# Patient Record
Sex: Male | Born: 1995 | Race: White | Hispanic: No | Marital: Single | Smoking: Current every day smoker
Health system: Southern US, Community
[De-identification: ages and names within clinical notes are randomized; demographics above are authoritative.]

## PROBLEM LIST (undated history)

## (undated) DIAGNOSIS — F1010 Alcohol abuse, uncomplicated: Secondary | ICD-10-CM

---

## 2017-02-23 ENCOUNTER — Emergency Department: Payer: Self-pay

## 2017-02-23 ENCOUNTER — Emergency Department
Admission: EM | Admit: 2017-02-23 | Discharge: 2017-02-23 | Disposition: A | Discharge status: Home / Self Care | Payer: Self-pay | Attending: Student in an Organized Health Care Education/Training Program | Admitting: Student in an Organized Health Care Education/Training Program

## 2017-02-23 DIAGNOSIS — R079 Chest pain, unspecified: Secondary | ICD-10-CM

## 2017-02-23 DIAGNOSIS — F419 Anxiety disorder, unspecified: Secondary | ICD-10-CM | POA: Insufficient documentation

## 2017-02-23 LAB — CBC WITH DIFFERENTIAL/PLATELET
BASOS PCT: 1 %
Basophils Absolute: 0 10*3/uL (ref 0–0.1)
EOS ABS: 0 10*3/uL (ref 0–0.7)
Eosinophils Relative: 0 %
HEMATOCRIT: 47 % (ref 40.0–52.0)
HEMOGLOBIN: 16.3 g/dL (ref 13.0–18.0)
LYMPHS ABS: 0.4 10*3/uL — AB (ref 1.0–3.6)
Lymphocytes Relative: 11 %
MCH: 32.2 pg (ref 26.0–34.0)
MCHC: 34.7 g/dL (ref 32.0–36.0)
MCV: 92.8 fL (ref 80.0–100.0)
Monocytes Absolute: 0.4 10*3/uL (ref 0.2–1.0)
Monocytes Relative: 10 %
NEUTROS PCT: 78 %
Neutro Abs: 3.1 10*3/uL (ref 1.4–6.5)
Platelets: 175 10*3/uL (ref 150–440)
RBC: 5.07 MIL/uL (ref 4.40–5.90)
RDW: 13.6 % (ref 11.5–14.5)
WBC: 4 10*3/uL (ref 3.8–10.6)

## 2017-02-23 LAB — BASIC METABOLIC PANEL
Anion gap: 9 (ref 5–15)
BUN: 12 mg/dL (ref 6–20)
CHLORIDE: 103 mmol/L (ref 101–111)
CO2: 25 mmol/L (ref 22–32)
Calcium: 9.7 mg/dL (ref 8.9–10.3)
Creatinine, Ser: 0.92 mg/dL (ref 0.61–1.24)
GFR calc non Af Amer: >60 mL/min (ref >60)
Glucose, Bld: 131 mg/dL — ABNORMAL HIGH (ref 65–99)
POTASSIUM: 3.8 mmol/L (ref 3.5–5.1)
SODIUM: 137 mmol/L (ref 135–145)

## 2017-02-23 LAB — TROPONIN I: Troponin I: <0.03 ng/mL (ref <0.03)

## 2017-02-23 LAB — FIBRIN DERIVATIVES D-DIMER (ARMC ONLY): FIBRIN DERIVATIVES D-DIMER (ARMC): 103.56 (ref 0.00–499.00)

## 2017-02-23 MED ORDER — LORAZEPAM 1 MG PO TABS
1.0000 mg | ORAL_TABLET | Freq: Once | ORAL | Status: AC
  Administered 2017-02-23: 1 mg via ORAL
  Filled 2017-02-23: qty 1

## 2017-02-23 NOTE — ED Provider Notes (Signed)
[facility] Emergency Department Provider Note    First MD Initiated Contact with Patient 02/23/17 [phone]     (approximate)  I have reviewed the triage vital signs and the nursing notes.   HISTORY  Chief Complaint Chest Pain    HPI Robert Briggs is a 21 y.o. male presents with intermittent chest pain shortness of breath for the past several weeks.States that the symptoms are associated with general warm feeling and occasional nausea. States it is worsened with stress at work. Was seen at urgent care and was told he had anxiety. Does have significant family history of anxiety on his mother's side. Denies any history of heart disease. Does not have any active chest pain right now but had episode this morning that he wanted to be checked out. Denies any SI or HI. No recent fevers. No nausea or vomiting.   History reviewed. No pertinent past medical history. FMH:  anxiety History reviewed. No pertinent surgical history. There are no active problems to display for this patient.     Prior to Admission medications   Not on File    Allergies Patient has no known allergies.    Social History Social History  Substance Use Topics  . Smoking status: Never Smoker  . Smokeless tobacco: Never Used  . Alcohol use No    Review of Systems Patient denies headaches, rhinorrhea, blurry vision, numbness, shortness of breath, chest pain, edema, cough, abdominal pain, nausea, vomiting, diarrhea, dysuria, fevers, rashes or hallucinations unless otherwise stated above in HPI. ____________________________________________   PHYSICAL EXAM:  VITAL SIGNS: Vitals:   02/23/17 0851  BP: (!) 146/89  Pulse: (!) 121  Resp: 20  Temp: 97.7 F (36.5 C)    Constitutional: Alert and oriented. Well appearing and in no acute distress. Eyes: Conjunctivae are normal. PERRL. EOMI. Head: Atraumatic. Nose: No congestion/rhinnorhea. Mouth/Throat: Mucous membranes are  moist.  Oropharynx non-erythematous. Neck: No stridor. Painless ROM. No cervical spine tenderness to palpation Hematological/Lymphatic/Immunilogical: No cervical lymphadenopathy. Cardiovascular: Normal rate, regular rhythm. Grossly normal heart sounds.  Good peripheral circulation. Respiratory: Normal respiratory effort.  No retractions. Lungs CTAB. Gastrointestinal: Soft and nontender. No distention. No abdominal bruits. No CVA tenderness. Genitourinary:  Musculoskeletal: No lower extremity tenderness nor edema.  No joint effusions. Neurologic:  Normal speech and language. No gross focal neurologic deficits are appreciated. No gait instability. Skin:  Skin is warm, dry and intact. No rash noted. Psychiatric: anxious appearing  ____________________________________________   LABS (all labs ordered are listed, but only abnormal results are displayed)  No results found for this or any previous visit (from the past 24 hour(s)). ____________________________________________  EKG My review and personal interpretation at Time: 4:17   Indication: chest pain  Rate: 130  Rhythm: sinus Axis: normal Other: sinus tachycardia, normal intervals, non specific st changes ____________________________________________  RADIOLOGY  I personally reviewed all radiographic images ordered to evaluate for the above acute complaints and reviewed radiology reports and findings.  These findings were personally discussed with the patient.  Please see medical record for radiology report.  ____________________________________________   PROCEDURES  Procedure(s) performed:  Procedures    Critical Care performed: no ____________________________________________   INITIAL IMPRESSION / ASSESSMENT AND PLAN / ED COURSE  Pertinent labs & imaging results that were available during my care of the patient were reviewed by me and considered in my medical decision making (see chart for details).  DDX: ACS,  pericarditis, esophagitis, boerhaaves, pe, dissection, pna, bronchitis, costochondritis   Robert Briggs is a 21 y.o. who presents to the ED with chest pain shortness of breath as described above. Patient is afebrile and well perfused but is tachycardic. Does not appear to be in any acute distress. Breath sounds are clear bilaterally. EKG shows sinus tachycardia but no evidence of dysrhythmia. Do suspect large component of this is secondary to anxiety but given his recurrent symptoms and tachycardia will give IV fluids and further risk stratify for pulmonary embolism with a d-dimer. Troponin is negative  The patient will be placed on continuous pulse oximetry and telemetry for monitoring.  Laboratory evaluation will be sent to evaluate for the above complaints.     Clinical Course as of Feb 23 1041  Tue Feb 23, 2017  1037 Blood work is all reassuring. D-dimer is negative. Tachycardia improving with Ativan. Patient stable and ambulated with a steady gait. Tolerating oral hydration. No evidence of dysrhythmia, WPW or Brugada.  Stable for follow up with PCP.  [PR]    Clinical Course User Index [PR] Willy Eddy, MD     ____________________________________________   FINAL CLINICAL IMPRESSION(S) / ED DIAGNOSES  Final diagnoses:  Chest pain, unspecified type  Anxiety      NEW MEDICATIONS STARTED DURING THIS VISIT:  New Prescriptions   No medications on file     Note:  This document was prepared using Dragon voice recognition software and may include unintentional dictation errors.    Willy Eddy, MD 02/23/17 [phone]

## 2017-02-23 NOTE — ED Notes (Signed)
Pt suffers from severe anxiety with chest discomfort at times. edp at bedside,

## 2017-02-23 NOTE — ED Notes (Signed)
Patient denies pain and is resting comfortably.  

## 2017-02-23 NOTE — ED Triage Notes (Signed)
Pt c/o intermittent chest pain with SOB for the past 2 weeks, states he went to urgent care 2 weeks ago with same and told it was anxiety.. Pt sent from [facility]

## 2017-04-16 ENCOUNTER — Ambulatory Visit: Payer: Self-pay | Admitting: Family Medicine

## 2021-04-14 ENCOUNTER — Emergency Department
Admission: EM | Admit: 2021-04-14 | Discharge: 2021-04-15 | Disposition: A | Discharge status: Home / Self Care | Payer: Self-pay | Attending: Emergency Medicine | Admitting: Emergency Medicine

## 2021-04-14 ENCOUNTER — Other Ambulatory Visit: Payer: Self-pay

## 2021-04-14 DIAGNOSIS — F419 Anxiety disorder, unspecified: Secondary | ICD-10-CM

## 2021-04-14 DIAGNOSIS — F1010 Alcohol abuse, uncomplicated: Secondary | ICD-10-CM | POA: Insufficient documentation

## 2021-04-14 DIAGNOSIS — F410 Panic disorder [episodic paroxysmal anxiety] without agoraphobia: Secondary | ICD-10-CM

## 2021-04-14 DIAGNOSIS — Z20822 Contact with and (suspected) exposure to covid-19: Secondary | ICD-10-CM | POA: Insufficient documentation

## 2021-04-14 DIAGNOSIS — R000 Tachycardia, unspecified: Secondary | ICD-10-CM | POA: Insufficient documentation

## 2021-04-14 DIAGNOSIS — F411 Generalized anxiety disorder: Secondary | ICD-10-CM | POA: Insufficient documentation

## 2021-04-14 DIAGNOSIS — Y908 Blood alcohol level of 240 mg/100 ml or more: Secondary | ICD-10-CM | POA: Insufficient documentation

## 2021-04-14 LAB — CBC
HCT: 44.5 % (ref 39.0–52.0)
Hemoglobin: 16.5 g/dL (ref 13.0–17.0)
MCH: 34.2 pg — ABNORMAL HIGH (ref 26.0–34.0)
MCHC: 37.1 g/dL — ABNORMAL HIGH (ref 30.0–36.0)
MCV: 92.1 fL (ref 80.0–100.0)
Platelets: 252 10*3/uL (ref 150–400)
RBC: 4.83 MIL/uL (ref 4.22–5.81)
RDW: 11.2 % — ABNORMAL LOW (ref 11.5–15.5)
WBC: 6.5 10*3/uL (ref 4.0–10.5)
nRBC: 0 % (ref 0.0–0.2)

## 2021-04-14 LAB — COMPREHENSIVE METABOLIC PANEL
ALT: 21 U/L (ref 0–44)
AST: 32 U/L (ref 15–41)
Albumin: 4.9 g/dL (ref 3.5–5.0)
Alkaline Phosphatase: 71 U/L (ref 38–126)
Anion gap: 13 (ref 5–15)
BUN: 5 mg/dL — ABNORMAL LOW (ref 6–20)
CO2: 22 mmol/L (ref 22–32)
Calcium: 9.2 mg/dL (ref 8.9–10.3)
Chloride: 97 mmol/L — ABNORMAL LOW (ref 98–111)
Creatinine, Ser: 0.71 mg/dL (ref 0.61–1.24)
GFR, Estimated: >60 mL/min (ref >60)
Glucose, Bld: 129 mg/dL — ABNORMAL HIGH (ref 70–99)
Potassium: 3.7 mmol/L (ref 3.5–5.1)
Sodium: 132 mmol/L — ABNORMAL LOW (ref 135–145)
Total Bilirubin: 0.9 mg/dL (ref 0.3–1.2)
Total Protein: 8.2 g/dL — ABNORMAL HIGH (ref 6.5–8.1)

## 2021-04-14 LAB — URINE DRUG SCREEN, QUALITATIVE (ARMC ONLY)
Amphetamines, Ur Screen: NOT DETECTED
Barbiturates, Ur Screen: NOT DETECTED
Benzodiazepine, Ur Scrn: NOT DETECTED
Cannabinoid 50 Ng, Ur ~~LOC~~: NOT DETECTED
Cocaine Metabolite,Ur ~~LOC~~: NOT DETECTED
MDMA (Ecstasy)Ur Screen: NOT DETECTED
Methadone Scn, Ur: NOT DETECTED
Opiate, Ur Screen: NOT DETECTED
Phencyclidine (PCP) Ur S: NOT DETECTED
Tricyclic, Ur Screen: NOT DETECTED

## 2021-04-14 LAB — ETHANOL: Alcohol, Ethyl (B): 285 mg/dL — ABNORMAL HIGH (ref <10)

## 2021-04-14 MED ORDER — SODIUM CHLORIDE 0.9 % IV BOLUS
1000.0000 mL | Freq: Once | INTRAVENOUS | Status: AC
  Administered 2021-04-14: 1000 mL via INTRAVENOUS

## 2021-04-14 MED ORDER — SODIUM CHLORIDE 0.9 % IV BOLUS: 1000.0000 mL | Freq: Once | INTRAVENOUS | Status: DC

## 2021-04-14 MED ORDER — THIAMINE HCL 100 MG PO TABS
100.0000 mg | ORAL_TABLET | Freq: Every day | ORAL | Status: DC
  Administered 2021-04-14 – 2021-04-15 (×2): 100 mg via ORAL
  Filled 2021-04-14 (×2): qty 1

## 2021-04-14 MED ORDER — LORAZEPAM 2 MG/ML IJ SOLN: 0.0000 mg | Freq: Two times a day (BID) | INTRAMUSCULAR | Status: DC

## 2021-04-14 MED ORDER — LORAZEPAM 2 MG PO TABS
0.0000 mg | ORAL_TABLET | Freq: Four times a day (QID) | ORAL | Status: DC
  Administered 2021-04-15: 2 mg via ORAL
  Filled 2021-04-14: qty 1

## 2021-04-14 MED ORDER — LORAZEPAM 2 MG PO TABS: 0.0000 mg | ORAL_TABLET | Freq: Two times a day (BID) | ORAL | Status: DC

## 2021-04-14 MED ORDER — THIAMINE HCL 100 MG/ML IJ SOLN: 100.0000 mg | Freq: Every day | INTRAMUSCULAR | Status: DC

## 2021-04-14 MED ORDER — LORAZEPAM 2 MG/ML IJ SOLN
2.0000 mg | Freq: Once | INTRAMUSCULAR | Status: AC
  Administered 2021-04-14: 2 mg via INTRAVENOUS
  Filled 2021-04-14: qty 1

## 2021-04-14 MED ORDER — LORAZEPAM 2 MG/ML IJ SOLN: 0.0000 mg | Freq: Four times a day (QID) | INTRAMUSCULAR | Status: DC

## 2021-04-14 NOTE — ED Notes (Signed)
Given blanket, pillow and stretcher to rest in. Denies further needs at this time.

## 2021-04-14 NOTE — ED Notes (Signed)
Pt father spoke with pt on the phone. Asking for update from nurse. Pt gives permission for this RN to update father.

## 2021-04-14 NOTE — ED Notes (Signed)
EDP at bedside  

## 2021-04-14 NOTE — BH Assessment (Signed)
Comprehensive Clinical Assessment (CCA) Note  04/14/2021 Robert PlantsChace E Briggs [id number]   Robert Briggs, 25 year old male who [facility] ED [facility] for treatment. Per triage note, Reports he awoke with anxiety today, drank heavily approx 14 beer and is having much anxiety. Drinks approx 12 beer/day. Pt appears intoxicated and this time with repetitive redirection by nurse. Father brought patient to ER.    During TTS assessment pt presents alert and oriented x 4, anxious but cooperative, and mood-congruent with affect. The pt does not appear to be responding to internal or external stimuli. Neither is the pt presenting with any delusional thinking. Pt verified the information provided to triage RN.   Pt identifies his main complaint to be that he is ready to stop drinking. Patient reports he is looking for inpatient treatment to detox from alcohol. Patient states he wants to get back to feeling like himself. Patient states working at a previous job lead to him having anxiety and drinking was a way to self-soothe. Patient states for the past 2 weeks he has been drinking 12-18 beers every day. Patient states he does not take any medication for his anxiety and has tried several times to work with a therapist. " It doesn't seem to help." Patient presented with tremors and denies having seizures. Patient reports living with his parents and he is currently unemployed. Pt denies SI/HI/AH/VH. Pt is able to contract for safety. TTS informed patient of local facilities that offer inpatient treatment and will also refer patient to other centers.    Chief Complaint:  Chief Complaint  Patient presents with  . Alcohol Problem   Visit Diagnosis: Alcohol Use Disorder    CCA Screening, Triage and Referral (STR)  Patient Reported Information How did you hear about us? Family/Friend  Referral name: No data recorded Referral phone number: No data recorded  Whom do you see for routine medical problems?  No data recorded Practice/Facility Name: No data recorded Practice/Facility Phone Number: No data recorded Name of Contact: No data recorded Contact Number: No data recorded Contact Fax Number: No data recorded Prescriber Name: No data recorded Prescriber Address (if known): No data recorded  What Is the Reason for Your Visit/Call Today? Patient wants detox from alcohol.  How Long Has This Been Causing You Problems? 1 wk - 1 month  What Do You Feel Would Help You the Most Today? Alcohol or Drug Use Treatment   Have You Recently Been in Any Inpatient Treatment (Hospital/Detox/Crisis Center/28-Day Program)? No  Name/Location of Program/Hospital:No data recorded How Long Were You There? No data recorded When Were You Discharged? No data recorded  Have You [facility]? No  Who [facility]? No data recorded  Have You Recently Had Any Thoughts About Hurting Yourself? No  Are You Planning to Commit Suicide/Harm Yourself At This time? No data recorded  Have you Recently Had Thoughts About Hurting Someone Robert Ohslse? No  Explanation: No data recorded  Have You Used Any Alcohol or Drugs in the Past 24 Hours? Yes  How Long Ago Did You Use Drugs or Alcohol? No data recorded What Did You Use and How Much? 12-18 beers daily   Do You Currently Have a Therapist/Psychiatrist? No  Name of Therapist/Psychiatrist: No data recorded  Have You Been Recently Discharged From Any Office Practice or Programs? No  Explanation of Discharge From Practice/Program: No data recorded    CCA Screening Triage Referral Assessment Type of Contact: Face-to-Face  Is this Initial or Reassessment? No data recorded Date Telepsych consult ordered in [facility]:  No data recorded Time Telepsych consult ordered in [facility]:  No data recorded  Patient Reported Information Reviewed? Yes  Patient Left Without Being Seen? No data recorded Reason for Not Completing  Assessment: No data recorded  Collateral Involvement: None provided   Does Patient Have a Court Appointed Legal Guardian? No data recorded Name and Contact of Legal Guardian: No data recorded If Minor and Not Living with Parent(s), Who has Custody? n/a  Is CPS involved or ever been involved? Never  Is APS involved or ever been involved? Never   Patient Determined To Be At Risk for Harm To Self or Others Based on Review of Patient Reported Information or Presenting Complaint? No  Method: No data recorded Availability of Means: No data recorded Intent: No data recorded Notification Required: No data recorded Additional Information for Danger to Others Potential: No data recorded Additional Comments for Danger to Others Potential: No data recorded Are There Guns or Other Weapons in Your Home? No data recorded Types of Guns/Weapons: No data recorded Are These Weapons Safely Secured?                            No data recorded Who Could Verify You Are Able To Have These Secured: No data recorded Do You Have any Outstanding Charges, Pending Court Dates, Parole/Probation? No data recorded Contacted To Inform of Risk of Harm To Self or Others: No data recorded  Location of Assessment: [facility] ED   Does Patient Present under Involuntary Commitment? No  IVC Papers Initial File Date: No data recorded  [place] of Residence: [place]   Patient Currently Receiving the Following Services: Not Receiving Services   Determination of Need: Urgent (48 hours)   Options For Referral: Inpatient Hospitalization; ED Visit     CCA Biopsychosocial Intake/Chief Complaint:  Patient states he is ready to stop drinking. "I want to be myself."  Current Symptoms/Problems: Tremors   Patient Reported Schizophrenia/Schizoaffective Diagnosis in Past: No   Strengths: No data recorded Preferences: No data recorded Abilities: No data recorded  Type of Services Patient Feels are Needed: No data  recorded  Initial Clinical Notes/Concerns: No data recorded  Mental Health Symptoms Depression:  None   Duration of Depressive symptoms: No data recorded  Mania:  None   Anxiety:   Difficulty concentrating; Restlessness   Psychosis:  None   Duration of Psychotic symptoms: No data recorded  Trauma:  None   Obsessions:  None   Compulsions:  None   Inattention:  None   Hyperactivity/Impulsivity:  N/A   Oppositional/Defiant Behaviors:  None   Emotional Irregularity:  Potentially harmful impulsivity   Other Mood/Personality Symptoms:  Severe anxiety    Mental Status Exam Appearance and self-care  Stature:  Tall   Weight:  Average weight   Clothing:  Dirty   Grooming:  Neglected   Cosmetic use:  None   Posture/gait:  -- (Laying in chair)   Motor activity:  Tremor   Sensorium  Attention:  Normal   Concentration:  Anxiety interferes   Orientation:  X5   Recall/memory:  Normal   Affect and Mood  Affect:  Anxious   Mood:  Anxious   Relating  Eye contact:  Normal   Facial expression:  Anxious   Attitude toward examiner:  Cooperative   Thought and Language  Speech flow: Slurred   Thought content:  Appropriate to Mood and Circumstances   Preoccupation:  None   Hallucinations:  None   Organization:  No data recorded  [employer] of Knowledge:  Average   Intelligence:  Average   Abstraction:  Functional   Judgement:  Impaired   Reality Testing:  Realistic   Insight:  Poor   Decision Making:  Impulsive   Social Functioning  Social Maturity:  Irresponsible; Impulsive   Social Judgement:  Naive   Stress  Stressors:  Work   Coping Ability:  Human resources officer Deficits:  Scientist, physiological; Self-care; Self-control   Supports:  Family     Religion:    Leisure/Recreation:    Exercise/Diet: Exercise/Diet Do You Exercise?: No Have You Gained or Lost A Significant Amount of Weight in the Past Six Months?: No Do  You Follow a Special Diet?: No Do You Have Any Trouble Sleeping?: Yes Explanation of Sleeping Difficulties: Patient states he only gets 4 hrs of sleep each night.   CCA Employment/Education Employment/Work Situation: Employment / Work Situation Employment situation: Unemployed Patient's job has been impacted by current illness: Yes Describe how patient's job has been impacted: Patient reports he developed anxiety at his job and began drinking to self-soothe.  Education: Education Is Patient Currently Attending School?: No   CCA Family/Childhood History Family and Relationship History: Family history Does patient have children?: No  Childhood History:  Childhood History By whom was/is the patient raised?: Both parents Patient's description of current relationship with people who raised him/her: Patient states he gets along well with his parents. They are supportive.  Child/Adolescent Assessment:     CCA Substance Use Alcohol/Drug Use: Alcohol / Drug Use Pain Medications: See PTA Prescriptions: See PTA Over the Counter: See PTA History of alcohol / drug use?: Yes Negative Consequences of Use: Financial,Work / School Withdrawal Symptoms: Tremors Substance #1 Name of Substance 1: Alcohol                       ASAM's:  Six Dimensions of Multidimensional Assessment  Dimension 1:  Acute Intoxication and/or Withdrawal Potential:      Dimension 2:  Biomedical Conditions and Complications:      Dimension 3:  Emotional, Behavioral, or Cognitive Conditions and Complications:     Dimension 4:  Readiness to Change:     Dimension 5:  Relapse, Continued use, or Continued Problem Potential:     Dimension 6:  Recovery/Living Environment:     ASAM Severity Score:    ASAM Recommended Level of Treatment:     Substance use Disorder (SUD) Substance Use Disorder (SUD)  Checklist Symptoms of Substance Use: Continued use despite having a persistent/recurrent  physical/psychological problem caused/exacerbated by use,Substance(s) often taken in larger amounts or over longer times than was intended,Evidence of tolerance,Presence of craving or strong urge to use,Evidence of withdrawal (Comment),Continued use despite persistent or recurrent social, interpersonal problems, caused or exacerbated by use,Persistent desire or unsuccessful efforts to cut down or control use  Recommendations for Services/Supports/Treatments: Recommendations for Services/Supports/Treatments Recommendations For Services/Supports/Treatments: SAIOP (Substance Abuse Intensive Outpatient Program),Detox,Inpatient Hospitalization  DSM5 Diagnoses: There are no problems to display for this patient.   Patient Centered Plan: Patient is on the following Treatment Plan(s):  Anxiety and Substance Abuse   Referrals to Alternative Service(s): Referred to Alternative Service(s):   Place:   Date:   Time:    Referred to Alternative Service(s):   Place:   Date:   Time:    Referred  to Alternative Service(s):   Place:   Date:   Time:    Referred to Alternative Service(s):   Place:   Date:   Time:     Anothy Bufano Dierdre Searles, Counselor, LCAS-A

## 2021-04-14 NOTE — ED Provider Notes (Signed)
[facility] Emergency Department Provider Note ____________________________________________   Event Date/Time   First MD Initiated Contact with Patient 04/14/21 1948     (approximate)  I have reviewed the triage vital signs and the nursing notes.   HISTORY  Chief Complaint Alcohol Problem    HPI Robert Briggs is a 25 y.o. male with PMH of alcohol abuse and anxiety who presents with increased feelings of anxiety today, worsening in the last several hours.  The patient states that he has never been on any medication for anxiety.  He states he drinks to deal with his anxiety, but it did not help today.  He states that his last drink was 3 or 4 hours ago and he feels like he is withdrawing.  He states that he drinks every day.  He denies any drug use.  He denies any other acute physical symptoms.   No past medical history on file.  There are no problems to display for this patient.   No past surgical history on file.  Prior to Admission medications   Not on File    Allergies Patient has no known allergies.  No family history on file.  Social History Social History   Tobacco Use  . Smoking status: Never Smoker  . Smokeless tobacco: Never Used  Substance Use Topics  . Alcohol use: No    Review of Systems  Constitutional: No fever/chills Eyes: No visual changes. ENT: No sore throat. Cardiovascular: Denies chest pain. Respiratory: Denies shortness of breath. Gastrointestinal: No vomiting or diarrhea.  Genitourinary: Negative for dysuria.  Musculoskeletal: Negative for back pain. Skin: Negative for rash. Neurological: Negative for headache.   ____________________________________________   PHYSICAL EXAM:  VITAL SIGNS: ED Triage Vitals  Enc Vitals Group     BP 04/14/21 1929 (!) 144/93     Pulse Rate 04/14/21 1929 (!) 139     Resp 04/14/21 1929 16     Temp 04/14/21 1929 99 F (37.2 C)     Temp Source 04/14/21 1929 Oral      SpO2 04/14/21 1929 97 %     Weight 04/14/21 1930 150 lb (68 kg)     Height 04/14/21 1930 6\' 3"  (1.905 m)     Head Circumference --      Peak Flow --      Pain Score 04/14/21 1930 0     Pain Loc --      Pain Edu? --      Excl. in GC? --     Constitutional: Alert and oriented.  Anxious appearing but in no acute distress. Eyes: Conjunctivae are normal.  Head: Atraumatic. Nose: No congestion/rhinnorhea. Mouth/Throat: Mucous membranes are slightly dry. Neck: Normal range of motion.  Cardiovascular: Tachycardia, regular rhythm. Good peripheral circulation. Respiratory: Normal respiratory effort.  No retractions.  Gastrointestinal: No distention.  Musculoskeletal: Extremities warm and well perfused.  Neurologic:  Normal speech and language. No gross focal neurologic deficits are appreciated.  No tremor or asterixis.  No tongue fasciculation. Skin:  Skin is warm and dry. No rash noted. Psychiatric: Calm and cooperative.  ____________________________________________   LABS (all labs ordered are listed, but only abnormal results are displayed)  Labs Reviewed  COMPREHENSIVE METABOLIC PANEL - Abnormal; Notable for the following components:      Result Value   Sodium 132 (*)    Chloride 97 (*)    Glucose, Bld 129 (*)    BUN 5 (*)    Total Protein 8.2 (*)  All other components within normal limits  CBC - Abnormal; Notable for the following components:   MCH 34.2 (*)    MCHC 37.1 (*)    RDW 11.2 (*)    All other components within normal limits  ETHANOL - Abnormal; Notable for the following components:   Alcohol, Ethyl (B) 285 (*)    All other components within normal limits  SARS CORONAVIRUS 2 (TAT 6-24 HRS)  URINE DRUG SCREEN, QUALITATIVE ([facility] ONLY)   ____________________________________________  EKG   ____________________________________________  RADIOLOGY    ____________________________________________   PROCEDURES  Procedure(s) performed:  No  Procedures  Critical Care performed: No ____________________________________________   INITIAL IMPRESSION / ASSESSMENT AND PLAN / ED COURSE  Pertinent labs & imaging results that were available during my care of the patient were reviewed by me and considered in my medical decision making (see chart for details).  26 year old male with a self-reported history of alcohol abuse and anxiety presents with increased anxiety today and states that he drank heavily all day until about 3 or 4 hours ago.  He feels that he is having alcohol withdrawal.  On exam, the patient is overall well-appearing.  He is tachycardic to the 130s with otherwise normal vital signs.  Exam is otherwise unremarkable.  Currently has no tremor or tongue fasciculation.  Overall presentation is consistent with anxiety and possible mild/early alcohol withdrawal.  We will give fluids, initially treated with Ativan, and then placed the patient on CIWA protocol.  I will consult TTS for detox referral.  ----------------------------------------- 11:13 PM on 04/14/2021 -----------------------------------------  Per the TTS provider, the patient will be sent to inpatient rehab.  He is currently calm and comfortable appearing and his tachycardia has improved.  I have signed him out to the oncoming ED physician Dr. Don Perking.  ____________________________________________   FINAL CLINICAL IMPRESSION(S) / ED DIAGNOSES  Final diagnoses:  Alcohol abuse  Anxiety      NEW MEDICATIONS STARTED DURING THIS VISIT:  New Prescriptions   No medications on file     Note:  This document was prepared using Dragon voice recognition software and may include unintentional dictation errors.    Dionne Bucy, MD 04/14/21 [phone]

## 2021-04-14 NOTE — BH Assessment (Signed)
Detox  Referral information for detox treatment faxed to:   [facility] [phone] or [phone])   [facility] [phone])   [facility] [phone])   [facility] [phone])- Patient spoke with Susie and completed pre-screen over the phone.    [facility] [phone]) (Walk-in only)  . [place] [phone])   [facility] [phone])  . [facility] [phone])  . [place] [phone])  . [facility] [phone])

## 2021-04-14 NOTE — ED Notes (Signed)
Pt on with RTS for pre-screening.

## 2021-04-14 NOTE — ED Notes (Signed)
Reports he awoke with anxiety today, drank heavily approx 14 beer and is having much anxiety. Drinks approx 12 beer/day. Pt appears intoxicated and this time with repetitive redirection by nurse. Father brought patient to ER.

## 2021-04-14 NOTE — ED Notes (Signed)
Updated EDP regarding most recent VS and tachycardia. Orders received

## 2021-04-14 NOTE — ED Triage Notes (Signed)
Pt states that he drank way too much today, pt reports that he has severe anxiety, states that his dad picked him up and told him he needed to get help

## 2021-04-15 DIAGNOSIS — F411 Generalized anxiety disorder: Secondary | ICD-10-CM

## 2021-04-15 DIAGNOSIS — F410 Panic disorder [episodic paroxysmal anxiety] without agoraphobia: Secondary | ICD-10-CM

## 2021-04-15 DIAGNOSIS — F1010 Alcohol abuse, uncomplicated: Secondary | ICD-10-CM

## 2021-04-15 LAB — SARS CORONAVIRUS 2 (TAT 6-24 HRS): SARS Coronavirus 2: NEGATIVE

## 2021-04-15 MED ORDER — ONDANSETRON 4 MG PO TBDP
4.0000 mg | ORAL_TABLET | Freq: Once | ORAL | Status: AC
  Administered 2021-04-15: 4 mg via ORAL
  Filled 2021-04-15: qty 1

## 2021-04-15 MED ORDER — FLUOXETINE HCL 20 MG PO CAPS: 20.0000 mg | ORAL_CAPSULE | Freq: Every day | ORAL | 2 refills | Status: DC

## 2021-04-15 MED ORDER — LORAZEPAM 1 MG PO TABS
1.0000 mg | ORAL_TABLET | Freq: Once | ORAL | Status: AC
  Administered 2021-04-15: 1 mg via ORAL
  Filled 2021-04-15: qty 1

## 2021-04-15 NOTE — ED Notes (Signed)
Pt c/o increased anxiety, pt repeatedly coming up to desk, appears anxious. Dr Katrinka Blazing notified

## 2021-04-15 NOTE — Consult Note (Signed)
[facility] Face-to-Face Psychiatry Consult   Reason for Consult: Consult for 25 year old man came to the emergency room intoxicated with anxiety symptoms Referring Physician: Katrinka Blazing Patient Identification: Robert Briggs MRN:  [id number] Principal Diagnosis: Generalized anxiety disorder Diagnosis:  Principal Problem:   Generalized anxiety disorder Active Problems:   Panic attacks   Alcohol abuse   Total Time spent with patient: 1 hour  Subjective:   Robert Briggs is a 25 y.o. male patient admitted with "I have a lot of anxiety".  HPI: Patient seen chart reviewed.  25 year old man presented intoxicated with a elevated blood alcohol level.  Today he has sobered up.  Only slightly shaky.  No sign of delirium no seizures.  He reports that he has chronic generalized anxiety.  Wakes up in the morning feels nervous.  Every little frustration makes him feel nervous.  Occasionally has panic attacks.  Overall however does not feel sad or hopeless.  Denies suicidal thoughts.  Denies hallucinations denies homicidal ideation.  Generally positive and forward looking.  Not currently receiving any treatment for alcohol or anxiety or mood disorder.  Denies regular use of other drugs although admits to occasionally using marijuana.  Past Psychiatric History: Patient says he has been seen at [facility] in the past but did not follow through with treatment.  Has been tried on medications for anxiety in the past including citalopram amitriptyline and hydroxyzine but did not find any of them satisfactory.  Has never had DTs or seizures from alcohol  Risk to Self:   Risk to Others:   Prior Inpatient Therapy:   Prior Outpatient Therapy:    Past Medical History: No past medical history on file. No past surgical history on file. Family History: No family history on file. Family Psychiatric  History: None reported Social History:  Social History   Substance and Sexual Activity  Alcohol Use No     Social History    Substance and Sexual Activity  Drug Use Not on file    Social History   Socioeconomic History  . Marital status: Single    Spouse name: Not on file  . Number of children: Not on file  . Years of education: Not on file  . Highest education level: Not on file  Occupational History  . Not on file  Tobacco Use  . Smoking status: Never Smoker  . Smokeless tobacco: Never Used  Substance and Sexual Activity  . Alcohol use: No  . Drug use: Not on file  . Sexual activity: Not on file  Other Topics Concern  . Not on file  Social History Narrative  . Not on file   Social Determinants of Health   Financial Resource Strain: Not on file  Food Insecurity: Not on file  Transportation Needs: Not on file  Physical Activity: Not on file  Stress: Not on file  Social Connections: Not on file   Additional Social History:    Allergies:  No Known Allergies  Labs:  Results for orders placed or performed during the hospital encounter of 04/14/21 (from the past 48 hour(s))  Urine Drug Screen, Qualitative     Status: None   Collection Time: 04/14/21  7:32 PM  Result Value Ref Range   Tricyclic, Ur Screen NONE DETECTED NONE DETECTED   Amphetamines, Ur Screen NONE DETECTED NONE DETECTED   MDMA (Ecstasy)Ur Screen NONE DETECTED NONE DETECTED   Cocaine Metabolite,Ur [place] NONE DETECTED NONE DETECTED   Opiate, Ur Screen NONE DETECTED NONE DETECTED   Phencyclidine (  PCP) Ur S NONE DETECTED NONE DETECTED   Cannabinoid 50 Ng, Ur [place] NONE DETECTED NONE DETECTED   Barbiturates, Ur Screen NONE DETECTED NONE DETECTED   Benzodiazepine, Ur Scrn NONE DETECTED NONE DETECTED   Methadone Scn, Ur NONE DETECTED NONE DETECTED    Comment: (NOTE) Tricyclics + metabolites, urine    Cutoff 1000 ng/mL Amphetamines + metabolites, urine  Cutoff 1000 ng/mL MDMA (Ecstasy), urine              Cutoff 500 ng/mL Cocaine Metabolite, urine          Cutoff 300 ng/mL Opiate + metabolites, urine        Cutoff 300  ng/mL Phencyclidine (PCP), urine         Cutoff 25 ng/mL Cannabinoid, urine                 Cutoff 50 ng/mL Barbiturates + metabolites, urine  Cutoff 200 ng/mL Benzodiazepine, urine              Cutoff 200 ng/mL Methadone, urine                   Cutoff 300 ng/mL  The urine drug screen provides only a preliminary, unconfirmed analytical test result and should not be used for non-medical purposes. Clinical consideration and professional judgment should be applied to any positive drug screen result due to possible interfering substances. A more specific alternate chemical method must be used in order to obtain a confirmed analytical result. Gas chromatography / mass spectrometry (GC/MS) is the preferred confirm atory method. Performed at [facility], [address] [place], [place] [address]   Comprehensive metabolic panel     Status: Abnormal   Collection Time: 04/14/21  7:48 PM  Result Value Ref Range   Sodium 132 (L) 135 - 145 mmol/L   Potassium 3.7 3.5 - 5.1 mmol/L   Chloride 97 (L) 98 - 111 mmol/L   CO2 22 22 - 32 mmol/L   Glucose, Bld 129 (H) 70 - 99 mg/dL    Comment: Glucose reference range applies only to samples taken after fasting for at least 8 hours.   BUN 5 (L) 6 - 20 mg/dL   Creatinine, Ser 6.040.71 0.61 - 1.24 mg/dL   Calcium 9.2 8.9 - 54.010.3 mg/dL   Total Protein 8.2 (H) 6.5 - 8.1 g/dL   Albumin 4.9 3.5 - 5.0 g/dL   AST 32 15 - 41 U/L   ALT 21 0 - 44 U/L   Alkaline Phosphatase 71 38 - 126 U/L   Total Bilirubin 0.9 0.3 - 1.2 mg/dL   GFR, Estimated >98>60 >11>60 mL/min    Comment: (NOTE) Calculated using the CKD-EPI Creatinine Equation (2021)    Anion gap 13 5 - 15    Comment: Performed at [facility], [address] [place], [place] [address]  cbc     Status: Abnormal   Collection Time: 04/14/21  7:48 PM  Result Value Ref Range   WBC 6.5 4.0 - 10.5 K/uL   RBC 4.83 4.22 - 5.81 MIL/uL   Hemoglobin 16.5 13.0 - 17.0 g/dL   HCT 29.544.5 62.139.0 - 30.852.0 %    MCV 92.1 80.0 - 100.0 fL   MCH 34.2 (H) 26.0 - 34.0 pg   MCHC 37.1 (H) 30.0 - 36.0 g/dL   RDW 65.711.2 (L) 84.611.5 - 96.215.5 %   Platelets 252 150 - 400 K/uL   nRBC 0.0 0.0 - 0.2 %    Comment:  Performed at [facility], [address] [place], [place] [address]  Ethanol     Status: Abnormal   Collection Time: 04/14/21  7:50 PM  Result Value Ref Range   Alcohol, Ethyl (B) 285 (H) <10 mg/dL    Comment: (NOTE) Lowest detectable limit for serum alcohol is 10 mg/dL.  For medical purposes only. Performed at [facility], [address] [place], [place] [address]   SARS CORONAVIRUS 2 (TAT 6-24 HRS) Nasopharyngeal Nasopharyngeal Swab     Status: None   Collection Time: 04/14/21  9:16 PM   Specimen: Nasopharyngeal Swab  Result Value Ref Range   SARS Coronavirus 2 NEGATIVE NEGATIVE    Comment: (NOTE) SARS-CoV-2 target nucleic acids are NOT DETECTED.  The SARS-CoV-2 RNA is generally detectable in upper and lower respiratory specimens during the acute phase of infection. Negative results do not preclude SARS-CoV-2 infection, do not rule out co-infections with other pathogens, and should not be used as the sole basis for treatment or other patient management decisions. Negative results must be combined with clinical observations, patient history, and epidemiological information. The expected result is Negative.  Fact Sheet for Patients: HairSlick.no  Fact Sheet for Healthcare Providers: quierodirigir.com  This test is not yet approved or cleared by the [place] FDA and  has been authorized for detection and/or diagnosis of SARS-CoV-2 by FDA under an Emergency Use Authorization (EUA). This EUA will remain  in effect (meaning this test can be used) for the duration of the COVID-19 declaration under Se ction 564(b)(1) of the Act, 21 U.S.C. section 360bbb-3(b)(1), unless the authorization is terminated or revoked  sooner.  Performed at [facility], [address], [place], [place] [address]     Current Facility-Administered Medications  Medication Dose Route Frequency Provider Last Rate Last Admin  . LORazepam (ATIVAN) injection 0-4 mg  0-4 mg Intravenous Q6H Dionne Bucy, MD       Or  . LORazepam (ATIVAN) tablet 0-4 mg  0-4 mg Oral Q6H Dionne Bucy, MD   2 mg at 04/15/21 0112  . [START ON 04/17/2021] LORazepam (ATIVAN) injection 0-4 mg  0-4 mg Intravenous Q12H Dionne Bucy, MD       Or  . Melene Muller ON 04/17/2021] LORazepam (ATIVAN) tablet 0-4 mg  0-4 mg Oral Q12H Dionne Bucy, MD      . thiamine tablet 100 mg  100 mg Oral Daily Dionne Bucy, MD   100 mg at 04/15/21 8756   Or  . thiamine (B-1) injection 100 mg  100 mg Intravenous Daily Dionne Bucy, MD       Current Outpatient Medications  Medication Sig Dispense Refill  . FLUoxetine (PROZAC) 20 MG capsule Take 1 capsule (20 mg total) by mouth daily. 30 capsule 2    Musculoskeletal: Strength & Muscle Tone: within normal limits Gait & Station: normal Patient leans: N/A            Psychiatric Specialty Exam:  Presentation  General Appearance: No data recorded Eye Contact:No data recorded Speech:No data recorded Speech Volume:No data recorded Handedness:No data recorded  Mood and Affect  Mood:No data recorded Affect:No data recorded  Thought Process  Thought Processes:No data recorded Descriptions of Associations:No data recorded Orientation:No data recorded Thought Content:No data recorded History of Schizophrenia/Schizoaffective disorder:No  Duration of Psychotic Symptoms:No data recorded Hallucinations:No data recorded Ideas of Reference:No data recorded Suicidal Thoughts:No data recorded Homicidal Thoughts:No data recorded  Sensorium  Memory:No data recorded Judgment:No data recorded Insight:No data recorded  Executive Functions  Concentration:No data  recorded Attention Span:No data recorded Recall:No data recorded [employer] of Knowledge:No data recorded Language:No data recorded  Psychomotor Activity  Psychomotor Activity:No data recorded  Assets  Assets:No data recorded  Sleep  Sleep:No data recorded  Physical Exam: Physical Exam Vitals and nursing note reviewed.  Constitutional:      Appearance: Normal appearance.  HENT:     Head: Normocephalic and atraumatic.     Mouth/Throat:     Pharynx: Oropharynx is clear.  Eyes:     Pupils: Pupils are equal, round, and reactive to light.  Cardiovascular:     Rate and Rhythm: Normal rate and regular rhythm.  Pulmonary:     Effort: Pulmonary effort is normal.     Breath sounds: Normal breath sounds.  Abdominal:     General: Abdomen is flat.     Palpations: Abdomen is soft.  Musculoskeletal:        General: Normal range of motion.  Skin:    General: Skin is warm and dry.  Neurological:     General: No focal deficit present.     Mental Status: He is alert. Mental status is at baseline.  Psychiatric:        Attention and Perception: Attention normal.        Mood and Affect: Mood normal.        Speech: Speech normal.        Behavior: Behavior is cooperative.        Thought Content: Thought content normal.        Cognition and Memory: Cognition normal.        Judgment: Judgment normal.    Review of Systems  Constitutional: Negative.   HENT: Negative.   Eyes: Negative.   Respiratory: Negative.   Cardiovascular: Negative.   Gastrointestinal: Negative.   Musculoskeletal: Negative.   Skin: Negative.   Neurological: Negative.   Psychiatric/Behavioral: Positive for substance abuse. Negative for depression, hallucinations, memory loss and suicidal ideas. The patient is nervous/anxious. The patient does not have insomnia.    Blood pressure (!) 137/93, pulse (!) 101, temperature 99 F (37.2 C), temperature source Oral, resp. rate 18, height 6\' 3"  (1.905 m), weight 68 kg, SpO2 98  %. Body mass index is 18.75 kg/m.  Treatment Plan Summary: Medication management and Plan 25 year old man with generalized anxiety disorder and panic attacks and alcohol abuse.  Not suicidal not dangerous not psychotic.  Medically stable.  Does not need inpatient treatment.  Gave patient psychoeducation and supportive counseling around treating anxiety disorders.  Recommended a trial of fluoxetine 20 mg a day.  Reviewed the need to be patient and give the medicine weeks to work.  Reviewed side effects.  Patient was also given the phone number of the liaison to [facility] and instructed to give them a call to set up follow-up for treatment of anxiety and alcohol abuse.  Case reviewed with ER physician.  Patient can otherwise be released from the emergency room.  Disposition: No evidence of imminent risk to self or others at present.   Patient does not meet criteria for psychiatric inpatient admission. Supportive therapy provided about ongoing stressors. Discussed crisis plan, support from social network, calling 911, coming to the Emergency Department, and calling Suicide Hotline.  30, MD 04/15/2021 10:29 AM

## 2021-04-15 NOTE — ED Notes (Signed)
Up to bathroom

## 2021-04-15 NOTE — ED Notes (Signed)
Pt given breakfast tray and phone to call father.

## 2021-04-15 NOTE — ED Notes (Addendum)
C/o nausea. Notified EDP.

## 2021-04-15 NOTE — ED Notes (Signed)
Dr. Toni Amend at bedside talking to pt.

## 2021-04-15 NOTE — ED Notes (Signed)
Pt sleeping at this time. Unable to obtain VS at this time due to patient finally resting peacefully on stretcher.

## 2021-04-15 NOTE — ED Provider Notes (Signed)
Patient seen and cleared for discharge by Dr. Toni Amend.  Discharged in stable condition.   Gilles Chiquito, MD 04/15/21 1025

## 2021-04-29 ENCOUNTER — Other Ambulatory Visit: Payer: Self-pay

## 2021-04-29 ENCOUNTER — Emergency Department
Admission: EM | Admit: 2021-04-29 | Discharge: 2021-04-30 | Disposition: A | Discharge status: Home / Self Care | Payer: Self-pay | Attending: Emergency Medicine | Admitting: Emergency Medicine

## 2021-04-29 DIAGNOSIS — F1010 Alcohol abuse, uncomplicated: Secondary | ICD-10-CM

## 2021-04-29 DIAGNOSIS — F419 Anxiety disorder, unspecified: Secondary | ICD-10-CM | POA: Insufficient documentation

## 2021-04-29 DIAGNOSIS — R002 Palpitations: Secondary | ICD-10-CM | POA: Insufficient documentation

## 2021-04-29 DIAGNOSIS — F10288 Alcohol dependence with other alcohol-induced disorder: Secondary | ICD-10-CM | POA: Insufficient documentation

## 2021-04-29 DIAGNOSIS — Z79899 Other long term (current) drug therapy: Secondary | ICD-10-CM | POA: Insufficient documentation

## 2021-04-29 DIAGNOSIS — F17210 Nicotine dependence, cigarettes, uncomplicated: Secondary | ICD-10-CM | POA: Insufficient documentation

## 2021-04-29 DIAGNOSIS — Z20822 Contact with and (suspected) exposure to covid-19: Secondary | ICD-10-CM | POA: Insufficient documentation

## 2021-04-29 DIAGNOSIS — Y901 Blood alcohol level of 20-39 mg/100 ml: Secondary | ICD-10-CM | POA: Insufficient documentation

## 2021-04-29 HISTORY — DX: Alcohol abuse, uncomplicated: F10.10

## 2021-04-29 LAB — URINE DRUG SCREEN, QUALITATIVE (ARMC ONLY)
Amphetamines, Ur Screen: NOT DETECTED
Barbiturates, Ur Screen: NOT DETECTED
Benzodiazepine, Ur Scrn: NOT DETECTED
Cannabinoid 50 Ng, Ur ~~LOC~~: NOT DETECTED
Cocaine Metabolite,Ur ~~LOC~~: NOT DETECTED
MDMA (Ecstasy)Ur Screen: NOT DETECTED
Methadone Scn, Ur: NOT DETECTED
Opiate, Ur Screen: NOT DETECTED
Phencyclidine (PCP) Ur S: NOT DETECTED
Tricyclic, Ur Screen: NOT DETECTED

## 2021-04-29 LAB — COMPREHENSIVE METABOLIC PANEL
ALT: 30 U/L (ref 0–44)
AST: 34 U/L (ref 15–41)
Albumin: 4.9 g/dL (ref 3.5–5.0)
Alkaline Phosphatase: 78 U/L (ref 38–126)
Anion gap: 11 (ref 5–15)
BUN: 5 mg/dL — ABNORMAL LOW (ref 6–20)
CO2: 24 mmol/L (ref 22–32)
Calcium: 9.6 mg/dL (ref 8.9–10.3)
Chloride: 93 mmol/L — ABNORMAL LOW (ref 98–111)
Creatinine, Ser: 0.75 mg/dL (ref 0.61–1.24)
GFR, Estimated: >60 mL/min (ref >60)
Glucose, Bld: 259 mg/dL — ABNORMAL HIGH (ref 70–99)
Potassium: 4.1 mmol/L (ref 3.5–5.1)
Sodium: 128 mmol/L — ABNORMAL LOW (ref 135–145)
Total Bilirubin: 0.9 mg/dL (ref 0.3–1.2)
Total Protein: 7.8 g/dL (ref 6.5–8.1)

## 2021-04-29 LAB — CBC
HCT: 39.8 % (ref 39.0–52.0)
Hemoglobin: 14.9 g/dL (ref 13.0–17.0)
MCH: 33.7 pg (ref 26.0–34.0)
MCHC: 37.4 g/dL — ABNORMAL HIGH (ref 30.0–36.0)
MCV: 90 fL (ref 80.0–100.0)
Platelets: 303 10*3/uL (ref 150–400)
RBC: 4.42 MIL/uL (ref 4.22–5.81)
RDW: 11.4 % — ABNORMAL LOW (ref 11.5–15.5)
WBC: 6.9 10*3/uL (ref 4.0–10.5)
nRBC: 0 % (ref 0.0–0.2)

## 2021-04-29 LAB — RESP PANEL BY RT-PCR (FLU A&B, COVID) ARPGX2
Influenza A by PCR: NEGATIVE
Influenza B by PCR: NEGATIVE
SARS Coronavirus 2 by RT PCR: NEGATIVE

## 2021-04-29 LAB — ETHANOL: Alcohol, Ethyl (B): 31 mg/dL — ABNORMAL HIGH (ref <10)

## 2021-04-29 MED ORDER — LORAZEPAM 2 MG PO TABS: 0.0000 mg | ORAL_TABLET | Freq: Two times a day (BID) | ORAL | Status: DC

## 2021-04-29 MED ORDER — THIAMINE HCL 100 MG/ML IJ SOLN
100.0000 mg | Freq: Every day | INTRAMUSCULAR | Status: DC
  Filled 2021-04-29: qty 2

## 2021-04-29 MED ORDER — THIAMINE HCL 100 MG PO TABS
100.0000 mg | ORAL_TABLET | Freq: Every day | ORAL | Status: DC
  Administered 2021-04-29: 100 mg via ORAL
  Filled 2021-04-29 (×2): qty 1

## 2021-04-29 MED ORDER — LORAZEPAM 2 MG/ML IJ SOLN: 0.0000 mg | Freq: Two times a day (BID) | INTRAMUSCULAR | Status: DC

## 2021-04-29 MED ORDER — LORAZEPAM 2 MG PO TABS
0.0000 mg | ORAL_TABLET | Freq: Four times a day (QID) | ORAL | Status: DC
  Administered 2021-04-29 – 2021-04-30 (×2): 1 mg via ORAL
  Filled 2021-04-29 (×2): qty 1

## 2021-04-29 MED ORDER — LORAZEPAM 2 MG/ML IJ SOLN: 0.0000 mg | Freq: Four times a day (QID) | INTRAMUSCULAR | Status: DC

## 2021-04-29 NOTE — ED Notes (Signed)
Pt sts he is feeling better after lorazepam.

## 2021-04-29 NOTE — ED Notes (Signed)
Pt sts he is more calm and appears more at ease at this time. Juice provided.

## 2021-04-29 NOTE — ED Triage Notes (Addendum)
Pt states he quit drinking 2 weeks ago and relapsed on Saturday and is wanting help, states the last  rehab will not take him back with in 30 days  Pt states last drink was around 930am

## 2021-04-29 NOTE — ED Notes (Signed)
VOL/ Detox/ Waiting on SW

## 2021-04-29 NOTE — ED Provider Notes (Signed)
[facility] Emergency Department Provider Note   ____________________________________________   Event Date/Time   First MD Initiated Contact with Patient 04/29/21 1904     (approximate)  I have reviewed the triage vital signs and the nursing notes.   HISTORY  Chief Complaint Alcohol Problem    HPI Robert Briggs is a 25 y.o. male with a stated past medical history of EtOH abuse who presents after relapsing into a 2-day bender just prior to arrival.  Patient presents requesting resources for alcohol detoxification as he states his detox facility would not take him within 30 days of discharge.  Patient complains of anxiety and slight palpitations.  Denies any exacerbating or relieving factors for the symptoms.  Patient currently denies any vision changes, tinnitus, difficulty speaking, facial droop, sore throat, chest pain, shortness of breath, abdominal pain, nausea/vomiting/diarrhea, dysuria, or weakness/numbness/paresthesias in any extremity         Past Medical History:  Diagnosis Date   Alcohol abuse     Patient Active Problem List   Diagnosis Date Noted   Generalized anxiety disorder 04/15/2021   Panic attacks 04/15/2021   Alcohol abuse 04/15/2021    History reviewed. No pertinent surgical history.  Prior to Admission medications   Medication Sig Start Date End Date Taking? Authorizing Provider  FLUoxetine (PROZAC) 20 MG capsule Take 1 capsule (20 mg total) by mouth daily. 04/15/21 07/14/21 Yes Clapacs, Jackquline Denmark, MD    Allergies Patient has no known allergies.  No family history on file.  Social History Social History   Tobacco Use   Smoking status: Every Day    Pack years: 0.00    Types: Cigarettes   Smokeless tobacco: Never  Substance Use Topics   Alcohol use: Yes    Comment: daily   Drug use: Not Currently    Review of Systems Constitutional: No fever/chills Eyes: No visual changes. ENT: No sore throat. Cardiovascular:  Denies chest pain. Respiratory: Denies shortness of breath. Gastrointestinal: No abdominal pain.  No nausea, no vomiting.  No diarrhea. Genitourinary: Negative for dysuria. Musculoskeletal: Negative for acute arthralgias Skin: Negative for rash. Neurological: Negative for headaches, weakness/numbness/paresthesias in any extremity Psychiatric: Negative for suicidal ideation/homicidal ideation   ____________________________________________   PHYSICAL EXAM:  VITAL SIGNS: ED Triage Vitals  Enc Vitals Group     BP 04/29/21 1822 (!) 160/102     Pulse Rate 04/29/21 1822 (!) 114     Resp 04/29/21 1822 18     Temp 04/29/21 1822 97.6 F (36.4 C)     Temp Source 04/29/21 1822 Oral     SpO2 04/29/21 1822 99 %     Weight 04/29/21 1823 150 lb (68 kg)     Height 04/29/21 1823 6\' 3"  (1.905 m)     Head Circumference --      Peak Flow --      Pain Score 04/29/21 1823 0     Pain Loc --      Pain Edu? --      Excl. in GC? --    Constitutional: Alert and oriented. Well appearing and in no acute distress. Eyes: Conjunctivae are normal. PERRL. Head: Atraumatic. Nose: No congestion/rhinnorhea. Mouth/Throat: Mucous membranes are moist. Neck: No stridor Cardiovascular: Grossly normal heart sounds.  Good peripheral circulation. Respiratory: Normal respiratory effort.  No retractions. Gastrointestinal: Soft and nontender. No distention. Musculoskeletal: No obvious deformities Neurologic:  Normal speech and language. No gross focal neurologic deficits are appreciated. Skin:  Skin is warm and  dry. No rash noted. Psychiatric: Mood and affect are normal. Speech and behavior are normal.  ____________________________________________   LABS (all labs ordered are listed, but only abnormal results are displayed)  Labs Reviewed  COMPREHENSIVE METABOLIC PANEL - Abnormal; Notable for the following components:      Result Value   Sodium 128 (*)    Chloride 93 (*)    Glucose, Bld 259 (*)    BUN 5  (*)    All other components within normal limits  ETHANOL - Abnormal; Notable for the following components:   Alcohol, Ethyl (B) 31 (*)    All other components within normal limits  CBC - Abnormal; Notable for the following components:   MCHC 37.4 (*)    RDW 11.4 (*)    All other components within normal limits  RESP PANEL BY RT-PCR (FLU A&B, COVID) ARPGX2  URINE DRUG SCREEN, QUALITATIVE ([facility] ONLY)   PROCEDURES  Procedure(s) performed (including Critical Care):  Procedures   ____________________________________________   INITIAL IMPRESSION / ASSESSMENT AND PLAN / ED COURSE  As part of my medical decision making, I reviewed the following data within the electronic MEDICAL RECORD NUMBER Nursing notes reviewed and incorporated, Labs reviewed, EKG interpreted, Old chart reviewed, Radiograph reviewed and Notes from prior ED visits reviewed and incorporated        Patient is a 25 year old male who presents requesting resources for alcohol detoxification.  Patient does not look like he is going through active withdrawal at this time however we will add CIWA protocol for any withdrawal symptoms that may occur.  I consulted TTS for placement in alcohol detoxification.  Care of this patient will be signed out to the oncoming physician at the end of my shift.  All pertinent patient information conveyed and all questions answered.  All further care and disposition decisions will be made by the oncoming physician.      ____________________________________________   FINAL CLINICAL IMPRESSION(S) / ED DIAGNOSES  Final diagnoses:  Alcohol abuse     ED Discharge Orders     None        Note:  This document was prepared using Dragon voice recognition software and may include unintentional dictation errors.    Merwyn Katos, MD 04/29/21 [phone]

## 2021-04-29 NOTE — ED Notes (Signed)
Pt c/o alcohol addiction and states he was sober x 2 weeks then started drinking again 2 days ago.   Last drink was 1000 today.  Pt reports average amount of drinking per day was 12-18 cans of beer plus several liquor shots.  At this time, pt c/o insomnia, anxiety, tremor, and an episode of vomiting this morning.

## 2021-04-29 NOTE — BH Assessment (Signed)
Comprehensive Clinical Assessment (CCA) Note  04/29/2021 Robert Briggs [id number]  Chief Complaint: Patient is a 25 year old male presenting to [facility] ED voluntarily requesting help with detox. Per triage note Pt states he quit drinking 2 weeks ago and relapsed on Saturday and is wanting help, states the last  rehab will not take him back with in 30 days Pt states last drink was around 930am. During assessment patient appeared alert and oriented x4, calm and cooperative, mood is pleasant. Patient reports that he drinks "12-18 beers" daily. He reports that he was just [facility] and reports that [facility] would be unable to accept him back before 30 days. Patient reports his longest sobriety "3 weeks to a month." Patient reports current withdrawal symptoms of insomnia, anxiety, tremor, and an episode of vomiting this morning. Patient has no history of seizures. Patient reports his recent relapse was "I went to a cookout and there was alcohol there." Patient reports lack of appetite and poor sleep. Patient denies SI/HI/AH/VH and does not appear to be responding to any internal or external stimuli Chief Complaint  Patient presents with   Alcohol Problem   Visit Diagnosis: Alcohol Use Disorder, Severe    CCA Screening, Triage and Referral (STR)  Patient Reported Information How did you hear about [place]? Self  Referral name: No data recorded Referral phone number: No data recorded  Whom do you see for routine medical problems? No data recorded Practice/Facility Name: No data recorded Practice/Facility Phone Number: No data recorded Name of Contact: No data recorded Contact Number: No data recorded Contact Fax Number: No data recorded Prescriber Name: No data recorded Prescriber Address (if known): No data recorded  What Is the Reason for Your Visit/Call Today? Patient is here voluntarily requesting assistance with alcohol  How Long Has This Been Causing You Problems? > than 6  months  What Do You Feel Would Help You the Most Today? Alcohol or Drug Use Treatment   Have You Recently Been in Any Inpatient Treatment (Hospital/Detox/Crisis Center/28-Day Program)? No  Name/Location of Program/Hospital:No data recorded How Long Were You There? No data recorded When Were You Discharged? No data recorded  Have You Ever Received Services From [facility] Before? No  Who Do You See at [facility]? No data recorded  Have You Recently Had Any Thoughts About Hurting Yourself? No  Are You Planning to Commit Suicide/Harm Yourself At This time? No   Have you Recently Had Thoughts About Hurting Someone Karolee Ohs? No  Explanation: No data recorded  Have You Used Any Alcohol or Drugs in the Past 24 Hours? Yes  How Long Ago Did You Use Drugs or Alcohol? No data recorded What Did You Use and How Much? Alcohol "12-18 beers"   Do You Currently Have a Therapist/Psychiatrist? No  Name of Therapist/Psychiatrist: No data recorded  Have You Been Recently Discharged From Any Office Practice or Programs? No  Explanation of Discharge From Practice/Program: No data recorded    CCA Screening Triage Referral Assessment Type of Contact: Face-to-Face  Is this Initial or Reassessment? No data recorded Date Telepsych consult ordered in [facility]:  No data recorded Time Telepsych consult ordered in [facility]:  No data recorded  Patient Reported Information Reviewed? Yes  Patient Left Without Being Seen? No data recorded Reason for Not Completing Assessment: No data recorded  Collateral Involvement: None provided   Does Patient Have a Court Appointed Legal Guardian? No data recorded Name and Contact of Legal Guardian: No data recorded  If Minor and Not Living with Parent(s), Who has Custody? n/a  Is CPS involved or ever been involved? Never  Is APS involved or ever been involved? Never   Patient Determined To Be At Risk for Harm To Self or Others Based on Review of Patient Reported  Information or Presenting Complaint? No  Method: No data recorded Availability of Means: No data recorded Intent: No data recorded Notification Required: No data recorded Additional Information for Danger to Others Potential: No data recorded Additional Comments for Danger to Others Potential: No data recorded Are There Guns or Other Weapons in Your Home? No data recorded Types of Guns/Weapons: No data recorded Are These Weapons Safely Secured?                            No data recorded Who Could Verify You Are Able To Have These Secured: No data recorded Do You Have any Outstanding Charges, Pending Court Dates, Parole/Probation? No data recorded Contacted To Inform of Risk of Harm To Self or Others: No data recorded  Location of Assessment: [facility] ED   Does Patient Present under Involuntary Commitment? No  IVC Papers Initial File Date: No data recorded  [place]   Patient Currently Receiving the Following Services: Not Receiving Services   Determination of Need: Emergent (2 hours)   Options For Referral: Inpatient Hospitalization; ED Visit     CCA Biopsychosocial Intake/Chief Complaint:  Patient states he is ready to stop drinking. "I want to be myself."  Current Symptoms/Problems: Tremors   Patient Reported Schizophrenia/Schizoaffective Diagnosis in Past: No   Strengths: Patient is able to communicate his needs  Preferences: No data recorded Abilities: No data recorded  Type of Services Patient Feels are Needed: No data recorded  Initial Clinical Notes/Concerns: No data recorded  Mental Health Symptoms Depression:   None   Duration of Depressive symptoms: No data recorded  Mania:   None   Anxiety:    None   Psychosis:   None   Duration of Psychotic symptoms: No data recorded  Trauma:   None   Obsessions:   None   Compulsions:   None   Inattention:   None   Hyperactivity/Impulsivity:   None   Oppositional/Defiant  Behaviors:   None   Emotional Irregularity:   None   Other Mood/Personality Symptoms:   Severe anxiety    Mental Status Exam Appearance and self-care  Stature:   Average   Weight:   Average weight   Clothing:   Casual   Grooming:   Normal   Cosmetic use:   None   Posture/gait:   Normal   Motor activity:   Not Remarkable   Sensorium  Attention:   Normal   Concentration:   Normal   Orientation:   X5   Recall/memory:   Normal   Affect and Mood  Affect:   Appropriate   Mood:   Other (Comment)   Relating  Eye contact:   Normal   Facial expression:   Responsive   Attitude toward examiner:   Cooperative   Thought and Language  Speech flow:  Clear and Coherent   Thought content:   Appropriate to Mood and Circumstances   Preoccupation:   None   Hallucinations:   None   Organization:  No data recorded  Affiliated Computer ServicesExecutive Functions  Fund of Knowledge:   Fair   Intelligence:   Average   Abstraction:   Normal  Judgement:   Good   Reality Testing:   Realistic   Insight:   Good   Decision Making:   Normal   Social Functioning  Social Maturity:   Responsible   Social Judgement:   Normal   Stress  Stressors:   Office manager Ability:   Normal   Skill Deficits:   None   Supports:   Family; Friends/Service system     Religion: Religion/Spirituality Are You A Religious Person?: No  Leisure/Recreation: Leisure / Recreation Do You Have Hobbies?: No  Exercise/Diet: Exercise/Diet Do You Exercise?: No Have You Gained or Lost A Significant Amount of Weight in the Past Six Months?: No Do You Follow a Special Diet?: No Do You Have Any Trouble Sleeping?: Yes Explanation of Sleeping Difficulties: Patient reports only getting a "couple hours" of sleep   CCA Employment/Education Employment/Work Situation: Employment / Work Systems developer:  (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?: No  Education: Education Is Patient Currently Attending School?: No Did Theme park manager?: Yes What Type of College Degree Do you Have?: Patient reports "some college" Did You Have An Individualized Education Program (IIEP): No Did You Have Any Difficulty At School?: No Patient's Education Has Been Impacted by Current Illness: No   CCA Family/Childhood History Family and Relationship History: Family history Marital status: Single Does patient have children?: No  Childhood History:  Childhood History By whom was/is the patient raised?: Both parents Did patient suffer any verbal/emotional/physical/sexual abuse as a child?: No Did patient suffer from severe childhood neglect?: No Has patient ever been sexually abused/assaulted/raped as an adolescent or adult?: No Was the patient ever a victim of a crime or a disaster?: No Witnessed domestic violence?: No Has patient been affected by domestic violence as an adult?: No  Child/Adolescent Assessment:     CCA Substance Use Alcohol/Drug Use: Alcohol / Drug Use Pain Medications: See MAR Prescriptions: See MAR Over the Counter: See MAR History of alcohol / drug use?: Yes Negative Consequences of Use: Financial, Personal relationships Withdrawal Symptoms: Change in blood pressure, Nausea / Vomiting, Patient aware of relationship between substance abuse and physical/medical complications, Tremors Substance #1 Name of Substance 1: Alcohol                       ASAM's:  Six Dimensions of Multidimensional Assessment  Dimension 1:  Acute Intoxication and/or Withdrawal Potential:      Dimension 2:  Biomedical Conditions and Complications:      Dimension 3:  Emotional, Behavioral, or Cognitive Conditions and Complications:     Dimension 4:  Readiness to Change:     Dimension 5:  Relapse, Continued use, or Continued Problem Potential:     Dimension  6:  Recovery/Living Environment:     ASAM Severity Score:    ASAM Recommended Level of Treatment:     Substance use Disorder (SUD) Substance Use Disorder (SUD)  Checklist Symptoms of Substance Use: Continued use despite having a persistent/recurrent physical/psychological problem caused/exacerbated by use, Evidence of tolerance, Presence of craving or strong urge to use, Social, occupational, recreational activities given up or reduced due to use, Evidence of withdrawal (Comment), Recurrent use that results in a failure to fulfill major role obligations (work, school, home), Large amounts of time spent to obtain, use or recover from the substance(s), Substance(s) often taken in larger amounts or over longer times than was intended,  Continued use despite persistent or recurrent social, interpersonal problems, caused or exacerbated by use, Persistent desire or unsuccessful efforts to cut down or control use, Repeated use in physically hazardous situations  Recommendations for Services/Supports/Treatments: Recommendations for Services/Supports/Treatments Recommendations For Services/Supports/Treatments: Detox Patient referral to be sent to [facility] and [facility] for detox  DSM5 Diagnoses: Patient Active Problem List   Diagnosis Date Noted   Generalized anxiety disorder 04/15/2021   Panic attacks 04/15/2021   Alcohol abuse 04/15/2021    Patient Centered Plan: Patient is on the following Treatment Plan(s):  Substance Abuse   Referrals to Alternative Service(s): Referred to Alternative Service(s):   Place:   Date:   Time:    Referred to Alternative Service(s):   Place:   Date:   Time:    Referred to Alternative Service(s):   Place:   Date:   Time:    Referred to Alternative Service(s):   Place:   Date:   Time:     Latoya Diskin A Yannick Steuber, LCAS-A

## 2021-04-30 NOTE — ED Provider Notes (Signed)
-----------------------------------------   6:02 AM on 04/30/2021 -----------------------------------------  Patient accepted to [facility], bed available after 8am. Patient will have family pick him up and bring him to [facility]. Strict return precautions given. Patient verbalizes understanding and agrees with plan of care.   Irean Hong, MD 04/30/21 [phone]

## 2021-04-30 NOTE — BH Assessment (Addendum)
Referral information for Detox  [facility] [phone]) No beds available tonight, encouraged to contact back in AM  [facility] [phone]) Currently under review with the nurse

## 2021-04-30 NOTE — BH Assessment (Signed)
PATIENT BED AVAILABLE TODAY 04/30/21 AFTER 8AM  Patient has been accepted to [facility] Recovery Accepting Charge Nurse is Welford Roche.  ER Staff is aware of it:  Dr. Dolores Frame, ER MD  Viviann Spare Patient's Nurse     Patient is requesting to transfer to facility via his family, patient is informed that if he changes his mind to contact the facility directly, patient is aggreeable

## 2021-04-30 NOTE — Discharge Instructions (Addendum)
Please go to [facility] this morning; your bed will be available after 8 AM.  Return to the ER for worsening symptoms, persistent vomiting, difficulty breathing or other concerns

## 2021-04-30 NOTE — ED Notes (Signed)
Pt given water, resting comfortably, has texted mother about picking him up for discharge to [facility] and waiting for response.

## 2021-05-17 ENCOUNTER — Other Ambulatory Visit: Payer: Self-pay

## 2021-05-17 ENCOUNTER — Ambulatory Visit (HOSPITAL_COMMUNITY)
Admission: EM | Admit: 2021-05-17 | Discharge: 2021-05-17 | Disposition: A | Discharge status: Home / Self Care | Payer: Self-pay | Attending: Behavioral Health | Admitting: Behavioral Health

## 2021-05-17 DIAGNOSIS — F1020 Alcohol dependence, uncomplicated: Secondary | ICD-10-CM | POA: Insufficient documentation

## 2021-05-17 DIAGNOSIS — F411 Generalized anxiety disorder: Secondary | ICD-10-CM | POA: Insufficient documentation

## 2021-05-17 DIAGNOSIS — Z79899 Other long term (current) drug therapy: Secondary | ICD-10-CM | POA: Insufficient documentation

## 2021-05-17 MED ORDER — GABAPENTIN 100 MG PO CAPS: 100.0000 mg | ORAL_CAPSULE | Freq: Two times a day (BID) | ORAL | 0 refills | Status: DC

## 2021-05-17 NOTE — Discharge Instructions (Addendum)

## 2021-05-17 NOTE — Progress Notes (Signed)
   05/17/21 1803  [facility]UC Triage Screening (Walk-ins at [facility] only)  How Did You Hear About [place]? Family/Friend  What Is the Reason for Your Visit/Call Today? Patient 25 year old male present to [facility] with concerns of alcoholism. Patient recently seen on 04/29/2021 at [facility] for the same issue. Report he started drinking at 25 year old and increased usage over the years. Report currently drinks to avoid withdrawal symptoms (he calls it anxiety). Report when he wakes in the morning he feels sick so he drinks a beer to feel better. Report he drinks 12-18 beers daily. Report have attempted several inpatient facilites and detox. Report has been on multiple multiplication and nothing has worked. Denied suicidal/homicidal ideations, denied auditory/visual hallucinations. Denied feelings of paranoia. Report, "I know it's all mental and I want to stop feeling bad."  How Long Has This Been Causing You Problems? > than 6 months  Have You Recently Had Any Thoughts About Hurting Yourself? No  Are You Planning to Commit Suicide/Harm Yourself At This time? No  Have you Recently Had Thoughts About Hurting Someone Karolee Ohs? No  Are You Planning To Harm Someone At This Time? No  Are you currently experiencing any auditory, visual or other hallucinations? No  Have You Used Any Alcohol or Drugs in the Past 24 Hours? Yes  How long ago did you use Drugs or Alcohol? today 05/17/2021 around noon  What Did You Use and How Much? Report has drank 6 beers today  Do you have any current medical co-morbidities that require immediate attention? No  Clinician description of patient physical appearance/behavior: Patient smells of alcohol but present himself in a pleasant manner.  What Do You Feel Would Help You the Most Today? Alcohol or Drug Use Treatment  If access to [facility] was not available, would you have sought care in the Emergency Department? Yes  Determination of Need Routine (7 days)

## 2021-05-17 NOTE — ED Notes (Signed)
Patient given AVS and Rx per provider.  Information reviewed and patient verbalized understanding of information presented.  Patient ambulatory.  Discharged in stable condition; no acute distress noted.

## 2021-05-17 NOTE — ED Provider Notes (Signed)
Behavioral Health Urgent Care Medical Screening Exam  Patient Name: Robert Briggs MRN: [id number] Date of Evaluation: 05/17/21 Chief Complaint:   Diagnosis:  Final diagnoses:  Generalized anxiety disorder    History of Present illness: Robert Briggs is a 25 y.o. male Patient presents to [facility] voluntarily as a walk-in accompanied by his parents with a chief complaint of "anxiety."  Patient reports that his anxiety has been crazy lately. He states that he started drinking to compensate. He states that he was at [facility] last month and was prescribed Prozac for his anxiety. He states that he took it for 1 month and then stopped because it was not working. He states that he was supposed to follow-up with a psychiatrist at [facility] for medication management but he didn't.  He states that his anxiety is ridiculous. He states that he self medicates with alcohol to treat the anxiety. He reports drinking 12-18 beers daily. He states that he last drank alcohol at noon today. He denies having any withdrawal symptoms. He states that his long longest period of sobriety was for a month and that was "a little over a month ago."  He states that alcohol is not the problem and that he is worried about his anxiety. He states that in the past he's has tried Celexa, Vistaril, and other medications without relief. He states that he was given Ativan  in the past for withdrawal symptoms and that helped with his anxiety.   On approach, the patient is alert and oriented x 4. Patient answers questions appropriately. Patient thought process is logical, speech coherent and tone is normal. His mood is anxious and congruent with affect. Patient is causally dressed. Patient denies suicidal thoughts. Patient denies homicidal ideations. Patient denies AVH. Patient does not appear to be responding to internal, or external stimuli. Patient denies using illicit drugs.   This  provider discussed treatment options with the patient. This provider advised the patient to continue taking the Prozac for his anxiety as it is first line treatment for anxiety. This provider discussed the side effects and benefits of taking gabapentin to help as an adjunct medication for anxiety and alcohol cravings. The patient agreed to the stated plan. He was provided with a 14 day prescription for gabapentin 100 mg twice daily. He was advised to follow up with [facility] in [place] for medication management. He was advised that Ativan is prescribed on an outpatient basis short-term for anxiety. We discussed substance options such as [facility] in [place] and [employer]. He showed little interest in needing resources for substance tx options.   Psychiatric Specialty Exam  Presentation  General Appearance:Appropriate for Environment  Eye Contact:Fair  Speech:Clear and Coherent  Speech Volume:Normal  Handedness:No data recorded  Mood and Affect  Mood:Anxious  Affect:Congruent   Thought Process  Thought Processes:Coherent  Descriptions of Associations:Intact  Orientation:Full (Time, Place and Person)  Thought Content:WDL  Diagnosis of Schizophrenia or Schizoaffective disorder in past: No   Hallucinations:None  Ideas of Reference:None  Suicidal Thoughts:No  Homicidal Thoughts:No   Sensorium  Memory:Immediate Fair; Recent Fair; Remote Fair  Judgment:Fair  Insight:Fair   Executive Functions  Concentration:Fair  Attention Span:Fair  Recall:Fair  Fund of Knowledge: No data recorded Language:Fair   Psychomotor Activity  Psychomotor Activity:Normal   Assets  Assets:Communication Skills; Desire for Improvement; Housing; Social Support   Sleep  Sleep:Poor  Number of hours: 3   No data recorded  Physical Exam: Physical Exam HENT:  Head: Normocephalic.     Nose: Nose normal.  Eyes:     Conjunctiva/sclera: Conjunctivae normal.  Cardiovascular:      Rate and Rhythm: Normal rate.  Pulmonary:     Effort: Pulmonary effort is normal.  Musculoskeletal:        General: Normal range of motion.     Cervical back: Normal range of motion.  Neurological:     General: No focal deficit present.     Mental Status: He is alert.   Review of Systems  Constitutional: Negative.   HENT: Negative.    Eyes: Negative.   Respiratory: Negative.    Cardiovascular: Negative.   Gastrointestinal: Negative.   Genitourinary: Negative.   Musculoskeletal: Negative.   Skin: Negative.   Neurological: Negative.   Endo/Heme/Allergies: Negative.   Psychiatric/Behavioral:  Positive for depression and substance abuse. The patient is nervous/anxious.   Blood pressure (!) 132/98, pulse 97, temperature 98 F (36.7 C), temperature source Oral, resp. rate 18, height 6\' 1"  (1.854 m), weight 147 lb (66.7 kg), SpO2 98 %. Body mass index is 19.39 kg/m.  Musculoskeletal: Strength & Muscle Tone: within normal limits Gait & Station: normal Patient leans: N/A   [facility] MSE Discharge Disposition for Follow up and Recommendations: Based on my evaluation the patient does not appear to have an emergency medical condition and can be discharged with resources and follow up care in outpatient services for Medication Management, Substance Abuse Intensive Outpatient Program, and Group Therapy  Continue taking Prozac 20 mg daily for anxiety. He was provided with a 14 day prescription for gabapentin 100 mg po twice daily.  Take all of you medications as prescribed by your mental healthcare provider.  Report any adverse effects and reactions from your medications to your outpatient provider promptly.  Do not engage in alcohol and or illegal drug use while on prescription medicines. Keep all scheduled appointments. This is to ensure that you are getting refills on time and to avoid any interruption in your medication.  If you are unable to keep an appointment call to reschedule.    Be  sure to follow up with resources and follow ups given for [facility] and [facility].    In the event of worsening symptoms call the crisis hotline, 911, and or go to the nearest emergency department for appropriate evaluation and treatment of symptoms.  Follow-up with your primary care provider for your medical issues, concerns and or health care needs.     , NP 05/17/2021, 6:54 PM

## 2021-05-18 ENCOUNTER — Encounter: Payer: Self-pay | Admitting: Emergency Medicine

## 2021-05-18 ENCOUNTER — Other Ambulatory Visit: Payer: Self-pay

## 2021-05-18 ENCOUNTER — Emergency Department
Admission: EM | Admit: 2021-05-18 | Discharge: 2021-05-18 | Disposition: A | Discharge status: Home / Self Care | Payer: Self-pay | Attending: Emergency Medicine | Admitting: Emergency Medicine

## 2021-05-18 DIAGNOSIS — F17210 Nicotine dependence, cigarettes, uncomplicated: Secondary | ICD-10-CM | POA: Insufficient documentation

## 2021-05-18 DIAGNOSIS — F419 Anxiety disorder, unspecified: Secondary | ICD-10-CM | POA: Insufficient documentation

## 2021-05-18 DIAGNOSIS — F410 Panic disorder [episodic paroxysmal anxiety] without agoraphobia: Secondary | ICD-10-CM | POA: Insufficient documentation

## 2021-05-18 LAB — CBC WITH DIFFERENTIAL/PLATELET
Abs Immature Granulocytes: 0.03 10*3/uL (ref 0.00–0.07)
Basophils Absolute: 0 10*3/uL (ref 0.0–0.1)
Basophils Relative: 0 %
Eosinophils Absolute: 0 10*3/uL (ref 0.0–0.5)
Eosinophils Relative: 0 %
HCT: 43.4 % (ref 39.0–52.0)
Hemoglobin: 15.5 g/dL (ref 13.0–17.0)
Immature Granulocytes: 0 %
Lymphocytes Relative: 12 %
Lymphs Abs: 0.9 10*3/uL (ref 0.7–4.0)
MCH: 33.3 pg (ref 26.0–34.0)
MCHC: 35.7 g/dL (ref 30.0–36.0)
MCV: 93.1 fL (ref 80.0–100.0)
Monocytes Absolute: 0.7 10*3/uL (ref 0.1–1.0)
Monocytes Relative: 10 %
Neutro Abs: 5.4 10*3/uL (ref 1.7–7.7)
Neutrophils Relative %: 78 %
Platelets: 247 10*3/uL (ref 150–400)
RBC: 4.66 MIL/uL (ref 4.22–5.81)
RDW: 11.4 % — ABNORMAL LOW (ref 11.5–15.5)
WBC: 7 10*3/uL (ref 4.0–10.5)
nRBC: 0 % (ref 0.0–0.2)

## 2021-05-18 LAB — COMPREHENSIVE METABOLIC PANEL
ALT: 41 U/L (ref 0–44)
AST: 33 U/L (ref 15–41)
Albumin: 4.8 g/dL (ref 3.5–5.0)
Alkaline Phosphatase: 75 U/L (ref 38–126)
Anion gap: 7 (ref 5–15)
BUN: 5 mg/dL — ABNORMAL LOW (ref 6–20)
CO2: 29 mmol/L (ref 22–32)
Calcium: 9.3 mg/dL (ref 8.9–10.3)
Chloride: 99 mmol/L (ref 98–111)
Creatinine, Ser: 0.71 mg/dL (ref 0.61–1.24)
GFR, Estimated: >60 mL/min (ref >60)
Glucose, Bld: 115 mg/dL — ABNORMAL HIGH (ref 70–99)
Potassium: 3.7 mmol/L (ref 3.5–5.1)
Sodium: 135 mmol/L (ref 135–145)
Total Bilirubin: 0.9 mg/dL (ref 0.3–1.2)
Total Protein: 7.7 g/dL (ref 6.5–8.1)

## 2021-05-18 LAB — TROPONIN I (HIGH SENSITIVITY): Troponin I (High Sensitivity): 3 ng/L (ref <18)

## 2021-05-18 MED ORDER — LORAZEPAM 2 MG PO TABS: 2.0000 mg | ORAL_TABLET | Freq: Once | ORAL | Status: DC

## 2021-05-18 MED ORDER — LORAZEPAM 1 MG PO TABS: 2.0000 mg | ORAL_TABLET | Freq: Two times a day (BID) | ORAL | 0 refills | Status: DC

## 2021-05-18 MED ORDER — LORAZEPAM 2 MG PO TABS
2.0000 mg | ORAL_TABLET | Freq: Once | ORAL | Status: AC
  Administered 2021-05-18: 2 mg via ORAL
  Filled 2021-05-18: qty 1

## 2021-05-18 MED ORDER — LORAZEPAM 1 MG PO TABS: ORAL_TABLET | ORAL | 0 refills | Status: DC

## 2021-05-18 NOTE — ED Notes (Signed)
Pt states he has had anxiety for 5 years. States he takes kratom and was recently started on gabapentin. Pt also admits to drinking 15 beers a day for the last few days. Pt appears anxious. States he does not feel well. Pt talking fast, drinking water. No resp distress noted. No sweating, vomiting noted. A&O, speaking in complete sentences. Steady gait noted as pt paces between stretcher and desk.

## 2021-05-18 NOTE — ED Notes (Signed)
EDP at bedside  

## 2021-05-18 NOTE — ED Notes (Signed)
Pt came up to desk stating he wanted to go home. Informed that TTS and psych have been consulted and that staff would like pt to stay to talk to them. States he just doesn't feel well but refuses to lay down on stretcher. Informed pt that if he wants to leave staff has to inform EDP first. Pt verbalized understanding. Initially walked down hallway to bathroom to fill up cup of water but then walked back to sit on stretcher.

## 2021-05-18 NOTE — ED Triage Notes (Signed)
Pt request not to get blood work at this time, he states that needles make him more nervous. I told him it is protocol but he would still like to wait.

## 2021-05-18 NOTE — BH Assessment (Signed)
Comprehensive Clinical Assessment (CCA) Note  05/18/2021 Robert Briggs [id number]  Chief Complaint:  Chief Complaint  Patient presents with   Anxiety   Visit Diagnosis: Alcohol Withdrawal   Pt is here requesting detox from alcohol. Pt reports using . Pt also reports that his anxiety sx are exacerbated when he attempts to stop drinking. Pt reports last use this morning and states that he would like medication to support him with managing his anxious mood. Pt explained that he mixed gabapentin recently given to him by a [rovoder in [employer] with a over the counter vitamin that he was taking and he began to feel horrible withdraw sx. He reports relief since being in the ER.  Pt endorses experiencing withdrawals at this time. Withdrawals include; restlessness, anxiety, Pt reports inability to maintain adequate sleep  and body aches No current or previous outpatient provider reported. Pt. denies any suicidal ideation, plan or intent. Pt. denies the presence of any auditory or visual hallucinations at this time. Patient denies any use of illicit drugs.Pt has agreed to follow up with [facility] for treatment.     CCA Screening, Triage and Referral (STR)  Patient Reported Information How did you hear about [place]? Family/Friend  Referral name: No data recorded Referral phone number: No data recorded  Whom do you see for routine medical problems? No data recorded Practice/Facility Name: No data recorded Practice/Facility Phone Number: No data recorded Name of Contact: No data recorded Contact Number: No data recorded Contact Fax Number: No data recorded Prescriber Name: No data recorded Prescriber Address (if known): No data recorded  What Is the Reason for Your Visit/Call Today? Patient 25 year old male present to [facility] with concerns of alcoholism. Patient recently seen on 04/29/2021 at [facility] for the same issue. Report he started drinking at 25 year old and increased usage  over the years. Report currently drinks to avoid withdrawal symptoms (he calls it anxiety). Report when he wakes in the morning he feels sick so he drinks a beer to feel better. Report he drinks 12-18 beers daily. Report have attempted several inpatient facilites and detox. Report has been on multiple multiplication and nothing has worked. Denied suicidal/homicidal ideations, denied auditory/visual hallucinations. Denied feelings of paranoia. Report, "I know it's all mental and I want to stop feeling bad."  How Long Has This Been Causing You Problems? > than 6 months  What Do You Feel Would Help You the Most Today? Alcohol or Drug Use Treatment   Have You Recently Been in Any Inpatient Treatment (Hospital/Detox/Crisis Center/28-Day Program)? No  Name/Location of Program/Hospital:No data recorded How Long Were You There? No data recorded When Were You Discharged? No data recorded  Have You Ever Received Services From [facility] Before? No  Who Do You See at [facility]? No data recorded  Have You Recently Had Any Thoughts About Hurting Yourself? No  Are You Planning to Commit Suicide/Harm Yourself At This time? No   Have you Recently Had Thoughts About Hurting Someone Robert Briggs? No  Explanation: No data recorded  Have You Used Any Alcohol or Drugs in the Past 24 Hours? Yes  How Long Ago Did You Use Drugs or Alcohol? No data recorded What Did You Use and How Much? Report has drank 6 beers today   Do You Currently Have a Therapist/Psychiatrist? No  Name of Therapist/Psychiatrist: No data recorded  Have You Been Recently Discharged From Any Office Practice or Programs? No  Explanation of Discharge From Practice/Program: No data recorded  CCA Screening Triage Referral Assessment Type of Contact: Face-to-Face  Is this Initial or Reassessment? No data recorded Date Telepsych consult ordered in [facility]:  No data recorded Time Telepsych consult ordered in [facility]:  No data  recorded  Patient Reported Information Reviewed? Yes  Patient Left Without Being Seen? No data recorded Reason for Not Completing Assessment: No data recorded  Collateral Involvement: None provided   Does Patient Have a Court Appointed Legal Guardian? No data recorded Name and Contact of Legal Guardian: No data recorded If Minor and Not Living with Parent(s), Who has Custody? n/a  Is CPS involved or ever been involved? Never  Is APS involved or ever been involved? Never   Patient Determined To Be At Risk for Harm To Self or Others Based on Review of Patient Reported Information or Presenting Complaint? No  Method: No data recorded Availability of Means: No data recorded Intent: No data recorded Notification Required: No data recorded Additional Information for Danger to Others Potential: No data recorded Additional Comments for Danger to Others Potential: No data recorded Are There Guns or Other Weapons in Your Home? No data recorded Types of Guns/Weapons: No data recorded Are These Weapons Safely Secured?                            No data recorded Who Could Verify You Are Able To Have These Secured: No data recorded Do You Have any Outstanding Charges, Pending Court Dates, Parole/Probation? No data recorded Contacted To Inform of Risk of Harm To Self or Others: No data recorded  Location of Assessment: [facility] ED   Does Patient Present under Involuntary Commitment? No  IVC Papers Initial File Date: No data recorded  [place] of Residence: [place]   Patient Currently Receiving the Following Services: Not Receiving Services   Determination of Need: Routine (7 days)   Options For Referral: Inpatient Hospitalization; ED Visit     CCA Biopsychosocial Intake/Chief Complaint:  Patient states he is ready to stop drinking. "I want to be myself."  Current Symptoms/Problems: Tremors   Patient Reported Schizophrenia/Schizoaffective Diagnosis in Past: No   Strengths:  Patient is able to communicate his needs  Preferences: No data recorded Abilities: No data recorded  Type of Services Patient Feels are Needed: No data recorded  Initial Clinical Notes/Concerns: No data recorded  Mental Health Symptoms Depression:   None   Duration of Depressive symptoms: No data recorded  Mania:   None   Anxiety:    None   Psychosis:   None   Duration of Psychotic symptoms: No data recorded  Trauma:   None   Obsessions:   None   Compulsions:   None   Inattention:   None   Hyperactivity/Impulsivity:   None   Oppositional/Defiant Behaviors:   None   Emotional Irregularity:   None   Other Mood/Personality Symptoms:   Severe anxiety    Mental Status Exam Appearance and self-care  Stature:   Average   Weight:   Average weight   Clothing:   Casual   Grooming:   Normal   Cosmetic use:   None   Posture/gait:   Normal   Motor activity:   Not Remarkable   Sensorium  Attention:   Normal   Concentration:   Normal   Orientation:   X5   Recall/memory:   Normal   Affect and Mood  Affect:   Appropriate   Mood:   Other (Comment)  Relating  Eye contact:   Normal   Facial expression:   Responsive   Attitude toward examiner:   Cooperative   Thought and Language  Speech flow:  Clear and Coherent   Thought content:   Appropriate to Mood and Circumstances   Preoccupation:   None   Hallucinations:   None   Organization:  No data recorded  [employer] of Knowledge:   Fair   Intelligence:   Average   Abstraction:   Normal   Judgement:   Good   Reality Testing:   Realistic   Insight:   Good   Decision Making:   Normal   Social Functioning  Social Maturity:   Responsible   Social Judgement:   Normal   Stress  Stressors:   Office manager Ability:   Normal   Skill Deficits:   None   Supports:   Family; Friends/Service system     Religion:     Leisure/Recreation:    Exercise/Diet:     CCA Employment/Education Employment/Work Situation:    Education:     CCA Family/Childhood History Family and Relationship History:    Childhood History:     Child/Adolescent Assessment:     CCA Substance Use Alcohol/Drug Use:                           ASAM's:  Six Dimensions of Multidimensional Assessment  Dimension 1:  Acute Intoxication and/or Withdrawal Potential:      Dimension 2:  Biomedical Conditions and Complications:      Dimension 3:  Emotional, Behavioral, or Cognitive Conditions and Complications:     Dimension 4:  Readiness to Change:     Dimension 5:  Relapse, Continued use, or Continued Problem Potential:     Dimension 6:  Recovery/Living Environment:     ASAM Severity Score:    ASAM Recommended Level of Treatment:     Substance use Disorder (SUD)    Recommendations for Services/Supports/Treatments:  Discharge is recommended- plan of care has been created jointly with pt, which involves patient following up with IOP. At this time patient is educated and verbalizes understanding of mental health resources and other crisis services in the community. T   DSM5 Diagnoses: Patient Active Problem List   Diagnosis Date Noted   Generalized anxiety disorder 04/15/2021   Panic attacks 04/15/2021   Alcohol abuse 04/15/2021    Patient Centered Plan: Patient is on the following Treatment Plan(s):  Anxiety and Substance Abuse   Referrals to Alternative Service(s): Referred to Alternative Service(s):   Place:   Date:   Time:    Referred to Alternative Service(s):   Place:   Date:   Time:    Referred to Alternative Service(s):   Place:   Date:   Time:    Referred to Alternative Service(s):   Place:   Date:   Time:     Asa Saunas

## 2021-05-18 NOTE — ED Notes (Signed)
This tech was asked by pt to check pt's blood pressure. He said he felt like he was going to pass out. Vitals were entered into Pt's chart. This tech strongly recommended pt sit back in the bed and put his legs up and try to relax. Pt declined several times saying he did not want to. This Pt will not stay on his stretcher in the hall. Is up pacing, over at the nurses station talking. Has been redirected to his stretcher via this tech.

## 2021-05-18 NOTE — ED Triage Notes (Signed)
Pt reports that he has a history of anxiety, he was put on Gabapentin recently and has been taking Kratom, he thinks that he felt jittery and thought that he it was because he mixed his medication. Pt is very anxious. After telling him his VS are fine he is now more calm.

## 2021-05-18 NOTE — Discharge Instructions (Addendum)
Please do not take both the kratom and gabapentin together.  Psych feels that the gabapentin should work for you.  I would not drink alcohol and take gabapentin either can result in unconsciousness and stopping breathing.  If you get worse come back and let us know we can give you some more Ativan.  In fact I will give you some Ativan to take today tomorrow and the next day.  We will be 1 more dose today 3 doses tomorrow and 2 doses the day after.  2 mg at a time.  If this is not working please return.  Do not drink and take the Ativan at the same time either.  That can also make you stop breathing.

## 2021-05-18 NOTE — ED Provider Notes (Addendum)
[facility] Emergency Department Provider Note   ____________________________________________   Event Date/Time   First MD Initiated Contact with Patient 05/18/21 1740     (approximate)  I have reviewed the triage vital signs and the nursing notes.   HISTORY  Chief Complaint Anxiety   HPI Robert Briggs is a 25 y.o. male who comes in complaining of a severe panic attack the worst he has ever had.  He says he feels like he is going to die.  Feels like his heart is racing.  He reports he is coming off of drinking alcohol and he was given a prescription for gabapentin yesterday he took 1 of those and a craton pill and shortly thereafter began feeling like this.  He has not had anything to drink today alcohol wise.  He is not taking his Prozac any longer.        Past Medical History:  Diagnosis Date   Alcohol abuse     Patient Active Problem List   Diagnosis Date Noted   Generalized anxiety disorder 04/15/2021   Panic attacks 04/15/2021   Alcohol abuse 04/15/2021    History reviewed. No pertinent surgical history.  Prior to Admission medications   Medication Sig Start Date End Date Taking? Authorizing Provider  LORazepam (ATIVAN) 1 MG tablet Ativan 2 mg tablets. Take 1 Ativan 2 mg tablet tonight. Take 1 Ativan 2 mg tablet 3 times a day tomorrow or every 8 hours. Take 1 Ativan 2 mg tablet twice a day or every 12 hours on July 12. Dispense 6 Ativan tablets 05/18/21  Yes Arnaldo Natal, MD  FLUoxetine (PROZAC) 20 MG capsule Take 1 capsule (20 mg total) by mouth daily. 04/15/21 07/14/21  Clapacs, Jackquline Denmark, MD  gabapentin (NEURONTIN) 100 MG capsule Take 1 capsule (100 mg total) by mouth 2 (two) times daily for 14 days. 05/17/21 05/31/21  Layla Barter, NP    Allergies Patient has no known allergies.  No family history on file.  Social History Social History   Tobacco Use   Smoking status: Every Day    Pack years: 0.00    Types: Cigarettes    Smokeless tobacco: Never  Substance Use Topics   Alcohol use: Yes    Comment: daily   Drug use: Not Currently    Review of Systems  Constitutional: No fever/chills Eyes: No visual changes. ENT: No sore throat. Cardiovascular: Denies chest pain. Respiratory: Denies shortness of breath. Gastrointestinal: No abdominal pain.  No nausea, no vomiting.  No diarrhea.  No constipation. Genitourinary: Negative for dysuria. Musculoskeletal: Negative for back pain. Skin: Negative for rash. Neurological: Negative for headaches, focal weakness   ____________________________________________   PHYSICAL EXAM:  VITAL SIGNS: ED Triage Vitals  Enc Vitals Group     BP 05/18/21 1654 (!) 139/97     Pulse Rate 05/18/21 1654 84     Resp 05/18/21 1654 20     Temp 05/18/21 1654 (!) 97.4 F (36.3 C)     Temp Source 05/18/21 1654 Oral     SpO2 05/18/21 1654 98 %     Weight 05/18/21 1655 147 lb (66.7 kg)     Height 05/18/21 1655 6\' 3"  (1.905 m)     Head Circumference --      Peak Flow --      Pain Score 05/18/21 1655 0     Pain Loc --      Pain Edu? --      Excl. in GC? --  Constitutional: Alert and oriented. Well appearing but does look anxious Eyes: Conjunctivae are normal. PERRL.  Head: Atraumatic. Nose: No congestion/rhinnorhea. Mouth/Throat: Mucous membranes are moist.  Oropharynx non-erythematous. Neck: No stridor.   Cardiovascular: Normal rate, regular rhythm. Grossly normal heart sounds.  Good peripheral circulation. Respiratory: Normal respiratory effort.  No retractions. Lungs CTAB. Gastrointestinal: Soft and nontender. No distention. No abdominal bruits. No CVA tenderness. Musculoskeletal: No lower extremity tenderness nor edema.   Neurologic:  Normal speech and language. No gross focal neurologic deficits are appreciated.  Skin:  Skin is warm, dry and intact. No rash noted.   ____________________________________________   LABS (all labs ordered are listed, but only  abnormal results are displayed)  Labs Reviewed  COMPREHENSIVE METABOLIC PANEL - Abnormal; Notable for the following components:      Result Value   Glucose, Bld 115 (*)    BUN 5 (*)    All other components within normal limits  CBC WITH DIFFERENTIAL/PLATELET - Abnormal; Notable for the following components:   RDW 11.4 (*)    All other components within normal limits  TROPONIN I (HIGH SENSITIVITY)   ____________________________________________  EKG  EKG read interpreted by me shows sinus rhythm at 99 normal axis ____________________________________________  RADIOLOGY Jill Poling, personally viewed and evaluated these images (plain radiographs) as part of my medical decision making, as well as reviewing the written report by the radiologist.  ED MD interpretation:    Official radiology report(s): No results found.  ____________________________________________   PROCEDURES  Procedure(s) performed (including Critical Care):  Procedures   ____________________________________________   INITIAL IMPRESSION / ASSESSMENT AND PLAN / ED COURSE  I spent some time discussing with the patient fact that he should not take the correct time and the gabapentin together psychiatry then saw him and told him he should be good with the gabapentin.  Patient had to be talked into staying for them to see him by me and the nurse because he just wanted to go home and drink 6 beers and he said if he did that he did feel better.  I gave him some Ativan for that reason.  I also gave him 1 discharge prescription for 1 more Ativan for tonight and a fairly rapid taper of Ativan over the next 2 further days.  Patient will return if he has any further symptoms.  He says he will not use the kratom anymore.  He will not use the Ativan in the alcohol as he understands and taken them together could result in him stopping to breathe.               ____________________________________________   FINAL CLINICAL IMPRESSION(S) / ED DIAGNOSES  Final diagnoses:  Anxiety  Panic attack     ED Discharge Orders          Ordered    LORazepam (ATIVAN) 1 MG tablet  2 times daily,   Status:  Discontinued        05/18/21 2114    LORazepam (ATIVAN) 1 MG tablet        05/18/21 2121             Note:  This document was prepared using Dragon voice recognition software and may include unintentional dictation errors.   Arnaldo Natal, MD 05/19/21 0045 I do need to add that the patient has follow-up with [facility] as needed.   Arnaldo Natal, MD 05/19/21 [phone]

## 2021-05-21 ENCOUNTER — Other Ambulatory Visit: Payer: Self-pay

## 2021-05-21 ENCOUNTER — Emergency Department
Admission: EM | Admit: 2021-05-21 | Discharge: 2021-05-21 | Disposition: A | Discharge status: Home / Self Care | Payer: Self-pay | Attending: Emergency Medicine | Admitting: Emergency Medicine

## 2021-05-21 ENCOUNTER — Encounter: Payer: Self-pay | Admitting: Emergency Medicine

## 2021-05-21 DIAGNOSIS — F10280 Alcohol dependence with alcohol-induced anxiety disorder: Secondary | ICD-10-CM | POA: Insufficient documentation

## 2021-05-21 DIAGNOSIS — Y905 Blood alcohol level of 100-119 mg/100 ml: Secondary | ICD-10-CM | POA: Insufficient documentation

## 2021-05-21 DIAGNOSIS — F411 Generalized anxiety disorder: Secondary | ICD-10-CM | POA: Insufficient documentation

## 2021-05-21 DIAGNOSIS — Z20822 Contact with and (suspected) exposure to covid-19: Secondary | ICD-10-CM | POA: Insufficient documentation

## 2021-05-21 DIAGNOSIS — F17210 Nicotine dependence, cigarettes, uncomplicated: Secondary | ICD-10-CM | POA: Insufficient documentation

## 2021-05-21 LAB — URINE DRUG SCREEN, QUALITATIVE (ARMC ONLY)
Amphetamines, Ur Screen: NOT DETECTED
Barbiturates, Ur Screen: NOT DETECTED
Benzodiazepine, Ur Scrn: NOT DETECTED
Cannabinoid 50 Ng, Ur ~~LOC~~: NOT DETECTED
Cocaine Metabolite,Ur ~~LOC~~: NOT DETECTED
MDMA (Ecstasy)Ur Screen: NOT DETECTED
Methadone Scn, Ur: NOT DETECTED
Opiate, Ur Screen: NOT DETECTED
Phencyclidine (PCP) Ur S: NOT DETECTED
Tricyclic, Ur Screen: NOT DETECTED

## 2021-05-21 LAB — CBC WITH DIFFERENTIAL/PLATELET
Abs Immature Granulocytes: 0.02 10*3/uL (ref 0.00–0.07)
Basophils Absolute: 0 10*3/uL (ref 0.0–0.1)
Basophils Relative: 1 %
Eosinophils Absolute: 0 10*3/uL (ref 0.0–0.5)
Eosinophils Relative: 0 %
HCT: 37.2 % — ABNORMAL LOW (ref 39.0–52.0)
Hemoglobin: 13.7 g/dL (ref 13.0–17.0)
Immature Granulocytes: 0 %
Lymphocytes Relative: 14 %
Lymphs Abs: 0.9 10*3/uL (ref 0.7–4.0)
MCH: 33.4 pg (ref 26.0–34.0)
MCHC: 36.8 g/dL — ABNORMAL HIGH (ref 30.0–36.0)
MCV: 90.7 fL (ref 80.0–100.0)
Monocytes Absolute: 0.5 10*3/uL (ref 0.1–1.0)
Monocytes Relative: 8 %
Neutro Abs: 5 10*3/uL (ref 1.7–7.7)
Neutrophils Relative %: 77 %
Platelets: 227 10*3/uL (ref 150–400)
RBC: 4.1 MIL/uL — ABNORMAL LOW (ref 4.22–5.81)
RDW: 11.3 % — ABNORMAL LOW (ref 11.5–15.5)
WBC: 6.5 10*3/uL (ref 4.0–10.5)
nRBC: 0 % (ref 0.0–0.2)

## 2021-05-21 LAB — COMPREHENSIVE METABOLIC PANEL
ALT: 40 U/L (ref 0–44)
AST: 34 U/L (ref 15–41)
Albumin: 4.7 g/dL (ref 3.5–5.0)
Alkaline Phosphatase: 82 U/L (ref 38–126)
Anion gap: 14 (ref 5–15)
BUN: <5 mg/dL — ABNORMAL LOW (ref 6–20)
CO2: 24 mmol/L (ref 22–32)
Calcium: 9.2 mg/dL (ref 8.9–10.3)
Chloride: 93 mmol/L — ABNORMAL LOW (ref 98–111)
Creatinine, Ser: 0.54 mg/dL — ABNORMAL LOW (ref 0.61–1.24)
GFR, Estimated: >60 mL/min (ref >60)
Glucose, Bld: 110 mg/dL — ABNORMAL HIGH (ref 70–99)
Potassium: 3.1 mmol/L — ABNORMAL LOW (ref 3.5–5.1)
Sodium: 131 mmol/L — ABNORMAL LOW (ref 135–145)
Total Bilirubin: 1.7 mg/dL — ABNORMAL HIGH (ref 0.3–1.2)
Total Protein: 7.5 g/dL (ref 6.5–8.1)

## 2021-05-21 LAB — RESP PANEL BY RT-PCR (FLU A&B, COVID) ARPGX2
Influenza A by PCR: NEGATIVE
Influenza B by PCR: NEGATIVE
SARS Coronavirus 2 by RT PCR: NEGATIVE

## 2021-05-21 LAB — ETHANOL: Alcohol, Ethyl (B): 114 mg/dL — ABNORMAL HIGH (ref <10)

## 2021-05-21 MED ORDER — CHLORDIAZEPOXIDE HCL 25 MG PO CAPS
50.0000 mg | ORAL_CAPSULE | Freq: Three times a day (TID) | ORAL | Status: DC
  Administered 2021-05-21 (×2): 50 mg via ORAL
  Filled 2021-05-21 (×2): qty 2

## 2021-05-21 NOTE — ED Provider Notes (Signed)
[facility] Emergency Department Provider Note  ____________________________________________  Time seen: Approximately 12:55 AM  I have reviewed the triage vital signs and the nursing notes.   HISTORY  Chief Complaint Alcohol Problem   HPI Robert Briggs is a 25 y.o. male with a history of alcohol abuse and generalized anxiety disorder who presents voluntarily for alcohol dependence.  Patient reports that he has an appointment for inpatient intake at [facility] in the morning.  Last alcohol intake was at 4 PM.  Patient reports that he has been binging for the last 5 days.  Wanted to drink tonight but his parents did not want to give him any alcohol and called the sheriff's department.  Patient arrives voluntarily requesting help to keep his anxiety under control and the symptoms of alcohol withdrawal and to he can go to his inpatient intake in the morning.  Patient reports generalized anxiety but denies headache, vomiting, diarrhea, chest pain, shortness of breath, SI or HI.  No prior history of complicated withdrawals.   Past Medical History:  Diagnosis Date   Alcohol abuse     Patient Active Problem List   Diagnosis Date Noted   Generalized anxiety disorder 04/15/2021   Panic attacks 04/15/2021   Alcohol abuse 04/15/2021    History reviewed. No pertinent surgical history.  Prior to Admission medications   Medication Sig Start Date End Date Taking? Authorizing Provider  FLUoxetine (PROZAC) 20 MG capsule Take 1 capsule (20 mg total) by mouth daily. 04/15/21 07/14/21  Clapacs, Jackquline Denmark, MD  gabapentin (NEURONTIN) 100 MG capsule Take 1 capsule (100 mg total) by mouth 2 (two) times daily for 14 days. 05/17/21 05/31/21  White, Patrice L, NP  LORazepam (ATIVAN) 1 MG tablet Ativan 2 mg tablets. Take 1 Ativan 2 mg tablet tonight. Take 1 Ativan 2 mg tablet 3 times a day tomorrow or every 8 hours. Take 1 Ativan 2 mg tablet twice a day or every 12 hours on July  12. Dispense 6 Ativan tablets 05/18/21   Arnaldo Natal, MD    Allergies Patient has no known allergies.  No family history on file.  Social History Social History   Tobacco Use   Smoking status: Every Day    Pack years: 0.00    Types: Cigarettes   Smokeless tobacco: Never  Vaping Use   Vaping Use: Never used  Substance Use Topics   Alcohol use: Yes    Comment: daily   Drug use: Not Currently    Review of Systems  Constitutional: Negative for fever. Eyes: Negative for visual changes. ENT: Negative for sore throat. Neck: No neck pain  Cardiovascular: Negative for chest pain. Respiratory: Negative for shortness of breath. Gastrointestinal: Negative for abdominal pain, vomiting or diarrhea. Genitourinary: Negative for dysuria. Musculoskeletal: Negative for back pain. Skin: Negative for rash. Neurological: Negative for headaches, weakness or numbness. Psych: No SI or HI. + anxiety  ____________________________________________   PHYSICAL EXAM:  VITAL SIGNS: ED Triage Vitals  Enc Vitals Group     BP 05/21/21 0043 (!) 145/108     Pulse Rate 05/21/21 0043 89     Resp 05/21/21 0043 19     Temp 05/21/21 0043 98.5 F (36.9 C)     Temp Source 05/21/21 0043 Oral     SpO2 05/21/21 0043 98 %     Weight 05/21/21 0047 145 lb (65.8 kg)     Height 05/21/21 0047 6\' 2"  (1.88 m)     Head Circumference --  Peak Flow --      Pain Score 05/21/21 0046 0     Pain Loc --      Pain Edu? --      Excl. in GC? --     Constitutional: Alert and oriented, anxious appearing.  HEENT:      Head: Normocephalic and atraumatic.         Eyes: Conjunctivae are normal. Sclera is non-icteric.       Mouth/Throat: Mucous membranes are moist.       Neck: Supple with no signs of meningismus. Cardiovascular: Regular rate and rhythm.  Respiratory: Normal respiratory effort.  Gastrointestinal: Soft, non tender, and non distended. Musculoskeletal: No edema, cyanosis, or erythema of  extremities. Neurologic: Normal speech and language. Face is symmetric. Moving all extremities. No gross focal neurologic deficits are appreciated. Skin: Skin is warm, dry and intact. No rash noted. Psychiatric: Mood and affect are normal. Speech and behavior are normal.  ____________________________________________   LABS (all labs ordered are listed, but only abnormal results are displayed)  Labs Reviewed  RESP PANEL BY RT-PCR (FLU A&B, COVID) ARPGX2  CBC WITH DIFFERENTIAL/PLATELET  COMPREHENSIVE METABOLIC PANEL  ETHANOL  URINE DRUG SCREEN, QUALITATIVE ([facility] ONLY)   ____________________________________________  EKG  none  ____________________________________________  RADIOLOGY  none  ____________________________________________   PROCEDURES  Procedure(s) performed: None Procedures Critical Care performed:  None ____________________________________________   INITIAL IMPRESSION / ASSESSMENT AND PLAN / ED COURSE  25 y.o. male with a history of alcohol abuse and generalized anxiety disorder who presents voluntarily for alcohol dependence.  Patient presenting with anxiety in the setting of not having any alcohol for the last 8 hours.  Patient is requesting help to keep his symptoms under control overnight so he can go to his [facility] inpatient intake in the morning.  Only complaint of anxiety at this time.  Will initiate Librium taper.  Otherwise vitals are okay with slightly elevated BP of 145/108.  We will monitor closely for any signs of complicated withdrawals.  We will get basic labs to rule out any significant electrolyte derangements, alcoholic ketoacidosis, dehydration.  Will consult [facility] to help arrange transport in the morning for patient's intake.  No indication for IVC at this time and patient will remain voluntarily.  Old medical records review including most recent visit to the ER 3 days ago.   The patient has been placed in psychiatric observation due to the need to  provide a safe environment for the patient while obtaining psychiatric consultation and evaluation, as well as ongoing medical and medication management to treat the patient's condition.  The patient has not been placed under full IVC at this time.    Please note:  Patient was evaluated in Emergency Department today for the symptoms described in the history of present illness. Patient was evaluated in the context of the global COVID-19 pandemic, which necessitated consideration that the patient might be at risk for infection with the SARS-CoV-2 virus that causes COVID-19. Institutional protocols and algorithms that pertain to the evaluation of patients at risk for COVID-19 are in a state of rapid change based on information released by regulatory bodies including the CDC and federal and state organizations. These policies and algorithms were followed during the patient's care in the ED.  Some ED evaluations and interventions may be delayed as a result of limited staffing during the pandemic.  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    Don Perking, [place], MD 05/21/21 [phone]

## 2021-05-21 NOTE — ED Triage Notes (Signed)
Patient ambulatory to triage with steady gait, without difficulty or distress noted, brought in voluntarily by [employer] for alcohol detox; reports seen recently for same and parents called tonight because he was "begging for beer"; last drink 5hrs PTA

## 2021-05-21 NOTE — ED Notes (Signed)
E-signature not working at this time. Pt verbalized understanding of D/C instructions, prescriptions and follow up care with no further questions at this time. Pt in NAD and ambulatory at time of D/C.  

## 2021-05-21 NOTE — ED Notes (Signed)
ED Provider at bedside. 

## 2021-05-21 NOTE — BH Assessment (Signed)
Comprehensive Clinical Assessment (CCA) Note  05/21/2021 Robert Briggs [id number] Recommendations for Services/Supports/Treatments: Pt has an appointment with [facility] in the AM. Pt to discharge in the AM and will be transported by parents.   Pt seen with "I woke up not feeling good". Pt noted to have normal eye contact and was drowsy due to being awakened by this Clinical research associate for the assessment. Pt reported that had no charges or upcoming court dates. Pt explained that he suffers from extreme anxiety and sleep disturbance. Pt had good judgment and no insight. Pt has a BAL of 114. Pt reports that he drinks 12-18 beers daily. Pt had a dysphoric mood and a congruent affect. Pt states he lives with his parents and that they are supportive. Pt was cooperative throughout the assessment. Pt denied having an abuse history. Pt not linked to any services; however, pt has an appointment with [facility] in the morning. The patient denied current SI, HI or AV/H.  Chief Complaint:  Chief Complaint  Patient presents with   Alcohol Problem   Visit Diagnosis: GAD Alcohol use disorder, severe   CCA Screening, Triage and Referral (STR)  Patient Reported Information How did you hear about [place]? Family/Friend  Referral name: No data recorded Referral phone number: No data recorded  Whom do you see for routine medical problems? No data recorded Practice/Facility Name: No data recorded Practice/Facility Phone Number: No data recorded Name of Contact: No data recorded Contact Number: No data recorded Contact Fax Number: No data recorded Prescriber Name: No data recorded Prescriber Address (if known): No data recorded  What Is the Reason for Your Visit/Call Today? Detox  How Long Has This Been Causing You Problems? <Week  What Do You Feel Would Help You the Most Today? Alcohol or Drug Use Treatment   Have You Recently Been in Any Inpatient Treatment (Hospital/Detox/Crisis Center/28-Day Program)? No  Name/Location  of Program/Hospital:No data recorded How Long Were You There? No data recorded When Were You Discharged? No data recorded  Have You Ever Received Services From [facility] Before? No  Who Do You See at [facility]? No data recorded  Have You Recently Had Any Thoughts About Hurting Yourself? No  Are You Planning to Commit Suicide/Harm Yourself At This time? No   Have you Recently Had Thoughts About Hurting Someone Karolee Ohs? No  Explanation: No data recorded  Have You Used Any Alcohol or Drugs in the Past 24 Hours? Yes  How Long Ago Did You Use Drugs or Alcohol? No data recorded What Did You Use and How Much? Unknown amount of alcohol   Do You Currently Have a Therapist/Psychiatrist? No  Name of Therapist/Psychiatrist: No data recorded  Have You Been Recently Discharged From Any Office Practice or Programs? No  Explanation of Discharge From Practice/Program: No data recorded    CCA Screening Triage Referral Assessment Type of Contact: Face-to-Face  Is this Initial or Reassessment? Initial Assessment  Date Telepsych consult ordered in [facility]:  05/18/21  Time Telepsych consult ordered in [facility]:  No data recorded  Patient Reported Information Reviewed? Yes  Patient Left Without Being Seen? No data recorded Reason for Not Completing Assessment: No data recorded  Collateral Involvement: None provided   Does Patient Have a Court Appointed Legal Guardian? No data recorded Name and Contact of Legal Guardian: No data recorded If Minor and Not Living with Parent(s), Who has Custody? n/a  Is CPS involved or ever been involved? Never  Is APS involved or ever been involved? Never  Patient Determined To Be At Risk for Harm To Self or Others Based on Review of Patient Reported Information or Presenting Complaint? No  Method: No Plan  Availability of Means: No access or NA  Intent: No data recorded Notification Required: No need or identified person  Additional Information  for Danger to Others Potential: No data recorded Additional Comments for Danger to Others Potential: No data recorded Are There Guns or Other Weapons in Your Home? No  Types of Guns/Weapons: No data recorded Are These Weapons Safely Secured?                            No data recorded Who Could Verify You Are Able To Have These Secured: No data recorded Do You Have any Outstanding Charges, Pending Court Dates, Parole/Probation? No data recorded Contacted To Inform of Risk of Harm To Self or Others: No data recorded  [facility]   Does Patient Present under Involuntary Commitment? No  IVC Papers Initial File Date: No data recorded  [place]    Patient Currently Receiving the Following Services: Not Receiving Services   Determination of Need: Routine (7 days)   Options For Referral: Other: Comment (Pt has an appointment with [facility])     CCA Biopsychosocial Intake/Chief Complaint:  Patient states he is ready to stop drinking. "I want to be myself."  Current Symptoms/Problems: Tremors   Patient Reported Schizophrenia/Schizoaffective Diagnosis in Past: No   Strengths: Patient is able to communicate his needs  Preferences: No data recorded Abilities: No data recorded  Type of Services Patient Feels are Needed: No data recorded  Initial Clinical Notes/Concerns: No data recorded  Mental Health Symptoms Depression:   None   Duration of Depressive symptoms: No data recorded  Mania:   None   Anxiety:    Tension; Worrying; Restlessness; Sleep   Psychosis:   None   Duration of Psychotic symptoms: No data recorded  Trauma:   None   Obsessions:   Cause anxiety; Good insight; Intrusive/time consuming; Recurrent & persistent thoughts/impulses/images   Compulsions:   "Driven" to perform behaviors/acts; Disrupts with routine/functioning; Intended to reduce stress or prevent another outcome; Intrusive/time consuming; Good  insight   Inattention:   None   Hyperactivity/Impulsivity:   None   Oppositional/Defiant Behaviors:   None   Emotional Irregularity:   None   Other Mood/Personality Symptoms:   Severe anxiety    Mental Status Exam Appearance and self-care  Stature:   Average   Weight:   Average weight   Clothing:   -- (Scrubs)   Grooming:   Normal   Cosmetic use:   None   Posture/gait:   Normal   Motor activity:   Not Remarkable   Sensorium  Attention:   Normal   Concentration:   Normal   Orientation:   X5   Recall/memory:   Normal   Affect and Mood  Affect:   Appropriate   Mood:   Anxious   Relating  Eye contact:   Normal   Facial expression:   Responsive   Attitude toward examiner:   Cooperative   Thought and Language  Speech flow:  Slurred   Thought content:   Appropriate to Mood and Circumstances   Preoccupation:   None   Hallucinations:   None   Organization:  No data recorded  [employer] ServicesExecutive Functions  Fund of Knowledge:   Fair   Intelligence:   Average  Abstraction:   Normal   Judgement:   Good   Reality Testing:   Realistic   Insight:   Good   Decision Making:   Normal   Social Functioning  Social Maturity:   Responsible   Social Judgement:   Normal   Stress  Stressors:   Office manager Ability:   Normal   Skill Deficits:   None   Supports:   Family; Friends/Service system     Religion: Religion/Spirituality Are You A Religious Person?: No  Leisure/Recreation: Leisure / Recreation Do You Have Hobbies?: No  Exercise/Diet: Exercise/Diet Do You Exercise?: No Have You Gained or Lost A Significant Amount of Weight in the Past Six Months?: No Do You Follow a Special Diet?: No Do You Have Any Trouble Sleeping?: Yes Explanation of Sleeping Difficulties: Patient reports only getting a "couple hours" of sleep   CCA Employment/Education Employment/Work Situation: Employment / Work  Situation Employment Situation: Unemployed Patient's Job has Been Impacted by Current Illness: No Describe how Patient's Job has Been Impacted: Patient reports he developed anxiety at his job and began drinking to self-soothe. Has Patient ever Been in the [employer]?: No  Education: Education Is Patient Currently Attending School?: No Last Grade Completed: 12 Did You Attend College?: Yes What Type of College Degree Do you Have?: Patient reports "some college" Did You Have An Individualized Education Program (IIEP): No Did You Have Any Difficulty At School?: No Patient's Education Has Been Impacted by Current Illness: No   CCA Family/Childhood History Family and Relationship History: Family history Marital status: Single Does patient have children?: No  Childhood History:  Childhood History By whom was/is the patient raised?: Both parents Did patient suffer any verbal/emotional/physical/sexual abuse as a child?: No Did patient suffer from severe childhood neglect?: No Has patient ever been sexually abused/assaulted/raped as an adolescent or adult?: No Was the patient ever a victim of a crime or a disaster?: No Witnessed domestic violence?: No Has patient been affected by domestic violence as an adult?: No  Child/Adolescent Assessment:     CCA Substance Use Alcohol/Drug Use: Alcohol / Drug Use Pain Medications: See MAR Prescriptions: See MAR Over the Counter: See MAR History of alcohol / drug use?: Yes Longest period of sobriety (when/how long): Unknown Negative Consequences of Use: Financial, Personal relationships Withdrawal Symptoms: Change in blood pressure, Nausea / Vomiting, Patient aware of relationship between substance abuse and physical/medical complications, Tremors Substance #1 Name of Substance 1: Alcohol                       ASAM's:  Six Dimensions of Multidimensional Assessment  Dimension 1:  Acute Intoxication and/or Withdrawal Potential:       Dimension 2:  Biomedical Conditions and Complications:      Dimension 3:  Emotional, Behavioral, or Cognitive Conditions and Complications:     Dimension 4:  Readiness to Change:     Dimension 5:  Relapse, Continued use, or Continued Problem Potential:     Dimension 6:  Recovery/Living Environment:     ASAM Severity Score:    ASAM Recommended Level of Treatment:     Substance use Disorder (SUD) Substance Use Disorder (SUD)  Checklist Symptoms of Substance Use: Continued use despite having a persistent/recurrent physical/psychological problem caused/exacerbated by use, Evidence of tolerance, Presence of craving or strong urge to use, Social, occupational, recreational activities given up or reduced due to use, Evidence of withdrawal (Comment), Recurrent use that results in a failure to  fulfill major role obligations (work, school, home), Large amounts of time spent to obtain, use or recover from the substance(s), Substance(s) often taken in larger amounts or over longer times than was intended, Continued use despite persistent or recurrent social, interpersonal problems, caused or exacerbated by use, Persistent desire or unsuccessful efforts to cut down or control use, Repeated use in physically hazardous situations  Recommendations for Services/Supports/Treatments: Recommendations for Services/Supports/Treatments Recommendations For Services/Supports/Treatments: Detox  DSM5 Diagnoses: Patient Active Problem List   Diagnosis Date Noted   Generalized anxiety disorder 04/15/2021   Panic attacks 04/15/2021   Alcohol abuse 04/15/2021    Gordon Carlson R Alexya Mcdaris, LCAS

## 2021-05-21 NOTE — BH Assessment (Signed)
Writer spoke with pt's family/support system Banta,Pam (Mother)  [phone]. Mother confirmed that pt has an appointment with [facility] on 05/21/21 at 5PM. Mother also confirmed that she could transport pt to the facility.

## 2021-05-21 NOTE — ED Provider Notes (Signed)
Patient reports his parents are coming to take him to rehab facility   Jene Every, MD 05/21/21 [phone]

## 2021-05-21 NOTE — ED Notes (Signed)
TTS at bedside. 

## 2021-05-21 NOTE — ED Notes (Signed)
VOL, Detox AM discharge to [facility] via parents

## 2021-07-16 ENCOUNTER — Emergency Department
Admission: EM | Admit: 2021-07-16 | Discharge: 2021-07-16 | Disposition: A | Discharge status: Home / Self Care | Payer: Self-pay | Attending: Emergency Medicine | Admitting: Emergency Medicine

## 2021-07-16 ENCOUNTER — Other Ambulatory Visit: Payer: Self-pay

## 2021-07-16 DIAGNOSIS — F1010 Alcohol abuse, uncomplicated: Secondary | ICD-10-CM

## 2021-07-16 DIAGNOSIS — F419 Anxiety disorder, unspecified: Secondary | ICD-10-CM

## 2021-07-16 DIAGNOSIS — Y907 Blood alcohol level of 200-239 mg/100 ml: Secondary | ICD-10-CM | POA: Insufficient documentation

## 2021-07-16 DIAGNOSIS — Z79899 Other long term (current) drug therapy: Secondary | ICD-10-CM | POA: Insufficient documentation

## 2021-07-16 DIAGNOSIS — F17210 Nicotine dependence, cigarettes, uncomplicated: Secondary | ICD-10-CM | POA: Insufficient documentation

## 2021-07-16 DIAGNOSIS — F10180 Alcohol abuse with alcohol-induced anxiety disorder: Secondary | ICD-10-CM | POA: Insufficient documentation

## 2021-07-16 LAB — CBC
HCT: 45.1 % (ref 39.0–52.0)
Hemoglobin: 16 g/dL (ref 13.0–17.0)
MCH: 32.3 pg (ref 26.0–34.0)
MCHC: 35.5 g/dL (ref 30.0–36.0)
MCV: 91.1 fL (ref 80.0–100.0)
Platelets: 234 10*3/uL (ref 150–400)
RBC: 4.95 MIL/uL (ref 4.22–5.81)
RDW: 12.5 % (ref 11.5–15.5)
WBC: 4.7 10*3/uL (ref 4.0–10.5)
nRBC: 0 % (ref 0.0–0.2)

## 2021-07-16 LAB — URINE DRUG SCREEN, QUALITATIVE (ARMC ONLY)
Amphetamines, Ur Screen: NOT DETECTED
Barbiturates, Ur Screen: NOT DETECTED
Benzodiazepine, Ur Scrn: NOT DETECTED
Cannabinoid 50 Ng, Ur ~~LOC~~: NOT DETECTED
Cocaine Metabolite,Ur ~~LOC~~: NOT DETECTED
MDMA (Ecstasy)Ur Screen: NOT DETECTED
Methadone Scn, Ur: NOT DETECTED
Opiate, Ur Screen: NOT DETECTED
Phencyclidine (PCP) Ur S: NOT DETECTED
Tricyclic, Ur Screen: NOT DETECTED

## 2021-07-16 LAB — COMPREHENSIVE METABOLIC PANEL
ALT: 27 U/L (ref 0–44)
AST: 33 U/L (ref 15–41)
Albumin: 5 g/dL (ref 3.5–5.0)
Alkaline Phosphatase: 71 U/L (ref 38–126)
Anion gap: 11 (ref 5–15)
BUN: 6 mg/dL (ref 6–20)
CO2: 27 mmol/L (ref 22–32)
Calcium: 9.3 mg/dL (ref 8.9–10.3)
Chloride: 99 mmol/L (ref 98–111)
Creatinine, Ser: 0.51 mg/dL — ABNORMAL LOW (ref 0.61–1.24)
GFR, Estimated: >60 mL/min (ref >60)
Glucose, Bld: 125 mg/dL — ABNORMAL HIGH (ref 70–99)
Potassium: 3.8 mmol/L (ref 3.5–5.1)
Sodium: 137 mmol/L (ref 135–145)
Total Bilirubin: 0.7 mg/dL (ref 0.3–1.2)
Total Protein: 7.9 g/dL (ref 6.5–8.1)

## 2021-07-16 LAB — ETHANOL: Alcohol, Ethyl (B): 229 mg/dL — ABNORMAL HIGH (ref <10)

## 2021-07-16 MED ORDER — LORAZEPAM 1 MG PO TABS
1.0000 mg | ORAL_TABLET | Freq: Once | ORAL | Status: AC
  Administered 2021-07-16: 1 mg via ORAL
  Filled 2021-07-16: qty 1

## 2021-07-16 NOTE — ED Provider Notes (Signed)
[facility] Emergency Department Provider Note  ____________________________________________   Event Date/Time   First MD Initiated Contact with Patient 07/16/21 1102     (approximate)  I have reviewed the triage vital signs and the nursing notes.   HISTORY  Chief Complaint Alcohol Problem   HPI Robert Briggs is a 25 y.o. male with past medical history anxiety depression as well as EtOH abuse having recently completed inpatient stay for inpatient rehab discharged he says 2 or 3 weeks ago who presents for assessment after reportedly relapsing and binge drinking on 4 bottles of wine since yesterday evening.  Patient states he started drinking again because he was feeling anxious.  He adamantly denies any SI, HI or hallucinations.  States he has talked to multiple psychiatrist but they have not been able to help him with anxiety.  He is not currently seeing anyone or take any medications for this.  States he feels little nauseous but otherwise has no new headache, earache, sore throat, chest pain, cough, shortness of breath, abdominal pain, diarrhea, burning with urination, rash or extremity pain.  He denies any recent injuries or falls.  States he wants to help with his drinking again and thinks he probably needs to go to rehab.         Past Medical History:  Diagnosis Date   Alcohol abuse     Patient Active Problem List   Diagnosis Date Noted   Generalized anxiety disorder 04/15/2021   Panic attacks 04/15/2021   Alcohol abuse 04/15/2021    History reviewed. No pertinent surgical history.  Prior to Admission medications   Medication Sig Start Date End Date Taking? Authorizing Provider  FLUoxetine (PROZAC) 20 MG capsule Take 1 capsule (20 mg total) by mouth daily. 04/15/21 07/14/21  Clapacs, Jackquline Denmark, MD  gabapentin (NEURONTIN) 100 MG capsule Take 1 capsule (100 mg total) by mouth 2 (two) times daily for 14 days. 05/17/21 05/31/21  White, Patrice L, NP   LORazepam (ATIVAN) 1 MG tablet Ativan 2 mg tablets. Take 1 Ativan 2 mg tablet tonight. Take 1 Ativan 2 mg tablet 3 times a day tomorrow or every 8 hours. Take 1 Ativan 2 mg tablet twice a day or every 12 hours on July 12. Dispense 6 Ativan tablets 05/18/21   Arnaldo Natal, MD    Allergies Patient has no known allergies.  No family history on file.  Social History Social History   Tobacco Use   Smoking status: Every Day    Types: Cigarettes   Smokeless tobacco: Never  Vaping Use   Vaping Use: Never used  Substance Use Topics   Alcohol use: Yes    Comment: daily   Drug use: Not Currently    Review of Systems  Review of Systems  Constitutional:  Negative for chills and fever.  HENT:  Negative for sore throat.   Eyes:  Negative for pain.  Respiratory:  Negative for cough and stridor.   Cardiovascular:  Negative for chest pain.  Gastrointestinal:  Positive for nausea. Negative for vomiting.  Genitourinary:  Negative for dysuria.  Musculoskeletal:  Negative for myalgias.  Skin:  Negative for rash.  Neurological:  Negative for seizures, loss of consciousness and headaches.  Psychiatric/Behavioral:  Positive for substance abuse. Negative for suicidal ideas.   All other systems reviewed and are negative.    ____________________________________________   PHYSICAL EXAM:  VITAL SIGNS: ED Triage Vitals  Enc Vitals Group     BP 07/16/21 1046 135/88  Pulse Rate 07/16/21 1046 90     Resp 07/16/21 1046 17     Temp 07/16/21 1046 97.8 F (36.6 C)     Temp Source 07/16/21 1046 Oral     SpO2 07/16/21 1046 98 %     Weight 07/16/21 1047 155 lb (70.3 kg)     Height 07/16/21 1047 6\' 3"  (1.905 m)     Head Circumference --      Peak Flow --      Pain Score 07/16/21 1047 9     Pain Loc --      Pain Edu? --      Excl. in GC? --    Vitals:   07/16/21 1046  BP: 135/88  Pulse: 90  Resp: 17  Temp: 97.8 F (36.6 C)  SpO2: 98%   Physical Exam Vitals and nursing note  reviewed.  Constitutional:      Appearance: He is well-developed.  HENT:     Head: Normocephalic and atraumatic.     Right Ear: External ear normal.     Left Ear: External ear normal.     Nose: Nose normal.  Eyes:     Conjunctiva/sclera: Conjunctivae normal.  Cardiovascular:     Rate and Rhythm: Normal rate and regular rhythm.     Heart sounds: No murmur heard. Pulmonary:     Effort: Pulmonary effort is normal. No respiratory distress.     Breath sounds: Normal breath sounds.  Abdominal:     Palpations: Abdomen is soft.     Tenderness: There is no abdominal tenderness.  Musculoskeletal:     Cervical back: Neck supple.  Skin:    General: Skin is warm and dry.  Neurological:     Mental Status: He is alert and oriented to person, place, and time.  Psychiatric:        Mood and Affect: Mood normal.    Patient is ambulating with steady gait and not slurring his words.  He appears clinically sober.  He is awake and alert and oriented.  He does not appear acutely psychotic or intoxicated or suicidal. ____________________________________________   LABS (all labs ordered are listed, but only abnormal results are displayed)  Labs Reviewed  COMPREHENSIVE METABOLIC PANEL - Abnormal; Notable for the following components:      Result Value   Glucose, Bld 125 (*)    Creatinine, Ser 0.51 (*)    All other components within normal limits  ETHANOL - Abnormal; Notable for the following components:   Alcohol, Ethyl (B) 229 (*)    All other components within normal limits  CBC  URINE DRUG SCREEN, QUALITATIVE ([facility] ONLY)   ____________________________________________  EKG  ____________________________________________  RADIOLOGY  ED MD interpretation:    Official radiology report(s): No results found.  ____________________________________________   PROCEDURES  Procedure(s) performed (including Critical  Care):  Procedures   ____________________________________________   INITIAL IMPRESSION / ASSESSMENT AND PLAN / ED COURSE      Patient presents with above-stated history exam requesting assistance with alcohol abuse and anxiety after recently relapsing last night drinking several bottles of wine after he states he was alcohol free for nearly 3 weeks.  On arrival he is afebrile hemodynamically stable.  He does not appear significantly clinically intoxicated and denies any other acute complaints other than some nausea.  No significant abdominal pain discharge acute pancreatitis there is no historical or exam features to suggest recent injuries or falls or other complicating factors from recent binge drinking.  CMP shows no  significant electrolyte or metabolic derangements.  No evidence of hepatitis or cholestasis.  CBC without leukocytosis or acute anemia.  Serum ethanol elevated to 29 suspect downtrending from last night.  Patient does not appear acutely intoxicated and I suspect prior to recent abstinence he had been at higher levels chronically.  He is not suicidal homicidal or psychotic and I do not believe he requires emergent psych eval.  While in the waiting room he was able to reach out to [facility] and arrange for a bed for himself there.  He stated he wished to be discharged to brother before 5 PM.  I think this is reasonable.  He does not appear acutely withdrawing.  Discharged stable condition.  Strict precautions advised and discussed.  Also advised to follow-up with [facility] for further assistance with his anxiety.        ____________________________________________   FINAL CLINICAL IMPRESSION(S) / ED DIAGNOSES  Final diagnoses:  Alcohol abuse  Anxiety    Medications  LORazepam (ATIVAN) tablet 1 mg (1 mg Oral Given 07/16/21 1155)     ED Discharge Orders     None        Note:  This document was prepared using Dragon voice recognition software and may include  unintentional dictation errors.    Gilles Chiquito, MD 07/16/21 1320

## 2021-07-16 NOTE — ED Triage Notes (Signed)
Pt states he is here for alcohol detox, states he had been dry for a while and yesterday drank 4 bottles of wine., pt is a/ox4

## 2021-07-16 NOTE — ED Notes (Signed)
E-signature not working at this time. Pt verbalized understanding of D/C instructions, prescriptions and follow up care with no further questions at this time. Pt in NAD and ambulatory at time of D/C.  

## 2021-08-29 ENCOUNTER — Other Ambulatory Visit: Payer: Self-pay

## 2021-08-29 ENCOUNTER — Encounter: Payer: Self-pay | Admitting: Emergency Medicine

## 2021-08-29 ENCOUNTER — Emergency Department
Admission: EM | Admit: 2021-08-29 | Discharge: 2021-08-29 | Disposition: A | Discharge status: Home / Self Care | Payer: Self-pay | Attending: Emergency Medicine | Admitting: Emergency Medicine

## 2021-08-29 DIAGNOSIS — Y908 Blood alcohol level of 240 mg/100 ml or more: Secondary | ICD-10-CM | POA: Insufficient documentation

## 2021-08-29 DIAGNOSIS — F10220 Alcohol dependence with intoxication, uncomplicated: Secondary | ICD-10-CM | POA: Insufficient documentation

## 2021-08-29 DIAGNOSIS — F17210 Nicotine dependence, cigarettes, uncomplicated: Secondary | ICD-10-CM | POA: Insufficient documentation

## 2021-08-29 LAB — COMPREHENSIVE METABOLIC PANEL
ALT: 53 U/L — ABNORMAL HIGH (ref 0–44)
AST: 56 U/L — ABNORMAL HIGH (ref 15–41)
Albumin: 4.3 g/dL (ref 3.5–5.0)
Alkaline Phosphatase: 66 U/L (ref 38–126)
Anion gap: 11 (ref 5–15)
BUN: 9 mg/dL (ref 6–20)
CO2: 24 mmol/L (ref 22–32)
Calcium: 9 mg/dL (ref 8.9–10.3)
Chloride: 107 mmol/L (ref 98–111)
Creatinine, Ser: 0.8 mg/dL (ref 0.61–1.24)
GFR, Estimated: >60 mL/min (ref >60)
Glucose, Bld: 107 mg/dL — ABNORMAL HIGH (ref 70–99)
Potassium: 3.9 mmol/L (ref 3.5–5.1)
Sodium: 142 mmol/L (ref 135–145)
Total Bilirubin: 0.8 mg/dL (ref 0.3–1.2)
Total Protein: 7.5 g/dL (ref 6.5–8.1)

## 2021-08-29 LAB — CBC
HCT: 43.8 % (ref 39.0–52.0)
Hemoglobin: 16.1 g/dL (ref 13.0–17.0)
MCH: 33.5 pg (ref 26.0–34.0)
MCHC: 36.8 g/dL — ABNORMAL HIGH (ref 30.0–36.0)
MCV: 91.1 fL (ref 80.0–100.0)
Platelets: 192 10*3/uL (ref 150–400)
RBC: 4.81 MIL/uL (ref 4.22–5.81)
RDW: 12.2 % (ref 11.5–15.5)
WBC: 3.9 10*3/uL — ABNORMAL LOW (ref 4.0–10.5)
nRBC: 0 % (ref 0.0–0.2)

## 2021-08-29 LAB — ETHANOL: Alcohol, Ethyl (B): 361 mg/dL (ref <10)

## 2021-08-29 NOTE — ED Notes (Signed)
ED MD notified of critical lab

## 2021-08-29 NOTE — ED Notes (Signed)
Patient given telephone to arrange a ride home.

## 2021-08-29 NOTE — ED Notes (Signed)
Pt Aunt came into the ED requesting information.  Staff explained to pt family that we cannot disclose any information without the patient permission.  Pt Aunt then asked staff to please chart the following information for the provider.  Aunt stated that they have been trying to have the pt committed for several months.  Mr Thielen will check himself into sober living facilities with good intent then check himself out prior to completion.  Aunt stated that if left unattended Mr Musich will go into family's bathrooms in their homes to drink mouthwash and has drank rubbing alcohol on one occasion.  The family is in fear for his safety d/t his poor judgement and addiction to alcohol.

## 2021-08-29 NOTE — ED Triage Notes (Signed)
Pt to ED via [facility] from home for alcohol problem. Per ems and patient he left rehab last night. Pt reports that he has not had anything to drink in 3 days but ems reports that per family aqt scene, pt was drinking and he passed out, ems also reports pts took klonopin. Pt denies taking klonopin and denies etoh today, however, pt is sleepy and slurring words. Pt is in NAD.

## 2021-08-29 NOTE — ED Notes (Signed)
Patient continues to attempt to find a ride and some where to stay.

## 2021-08-29 NOTE — ED Notes (Addendum)
Pt is sleeping will do vitals when he is awake

## 2021-08-29 NOTE — ED Provider Notes (Signed)
[facility] Emergency Department Provider Note  ____________________________________________  Time seen: Approximately 4:07 PM  I have reviewed the triage vital signs and the nursing notes.   HISTORY  Chief Complaint Alcohol Problem    HPI Robert Briggs is a 25 y.o. male with a past history of anxiety and alcohol abuse who comes to the ED due to intoxication today.  Family was at the scene and wanted the patient evaluated.  Patient was recently at an alcohol rehab for the last 4 days.  He decided to leave yesterday.  He has been staying at his cousin's house, and last night drank a 12 pack of beer.  Denies any alcohol use today.  Does not take any other drugs, denies medications.  Denies any falls or trauma, denies any nausea or pain.  States that he just like to stay here at the hospital until he feels back to normal.  Denies SI HI or hallucinations.    Past Medical History:  Diagnosis Date  . Alcohol abuse      Patient Active Problem List   Diagnosis Date Noted  . Generalized anxiety disorder 04/15/2021  . Panic attacks 04/15/2021  . Alcohol abuse 04/15/2021     History reviewed. No pertinent surgical history.   Prior to Admission medications   Medication Sig Start Date End Date Taking? Authorizing Provider  FLUoxetine (PROZAC) 20 MG capsule Take 1 capsule (20 mg total) by mouth daily. 04/15/21 07/14/21  Clapacs, Jackquline Denmark, MD  gabapentin (NEURONTIN) 100 MG capsule Take 1 capsule (100 mg total) by mouth 2 (two) times daily for 14 days. 05/17/21 05/31/21  White, Patrice L, NP  LORazepam (ATIVAN) 1 MG tablet Ativan 2 mg tablets. Take 1 Ativan 2 mg tablet tonight. Take 1 Ativan 2 mg tablet 3 times a day tomorrow or every 8 hours. Take 1 Ativan 2 mg tablet twice a day or every 12 hours on July 12. Dispense 6 Ativan tablets 05/18/21   Arnaldo Natal, MD     Allergies Patient has no known allergies.   No family history on file.  Social  History Social History   Tobacco Use  . Smoking status: Every Day    Types: Cigarettes  . Smokeless tobacco: Never  Vaping Use  . Vaping Use: Never used  Substance Use Topics  . Alcohol use: Yes    Comment: daily  . Drug use: Not Currently    Review of Systems  Constitutional:   No fever or chills.  ENT:   No sore throat. No rhinorrhea. Cardiovascular:   No chest pain or syncope. Respiratory:   No dyspnea or cough. Gastrointestinal:   Negative for abdominal pain, vomiting and diarrhea.  Musculoskeletal:   Negative for focal pain or swelling All other systems reviewed and are negative except as documented above in ROS and HPI.  ____________________________________________   PHYSICAL EXAM:  VITAL SIGNS: ED Triage Vitals  Enc Vitals Group     BP 08/29/21 1544 123/88     Pulse Rate 08/29/21 1544 95     Resp 08/29/21 1544 16     Temp 08/29/21 1544 97.9 F (36.6 C)     Temp Source 08/29/21 1544 Oral     SpO2 08/29/21 1544 97 %     Weight 08/29/21 1542 176 lb (79.8 kg)     Height --      Head Circumference --      Peak Flow --      Pain Score 08/29/21 1542  0     Pain Loc --      Pain Edu? --      Excl. in GC? --     Vital signs reviewed, nursing assessments reviewed.   Constitutional:   Alert and oriented. Non-toxic appearance. Eyes:   Conjunctivae are normal. EOMI. PERRL. ENT      Head:   Normocephalic and atraumatic.      Nose:   Normal.      Mouth/Throat:   Normal, moist mucosa      Neck:   No meningismus. Full ROM. Hematological/Lymphatic/Immunilogical:   No cervical lymphadenopathy. Cardiovascular:   RRR. Symmetric bilateral radial and DP pulses.  No murmurs. Cap refill less than 2 seconds. Respiratory:   Normal respiratory effort without tachypnea/retractions. Breath sounds are clear and equal bilaterally. No wheezes/rales/rhonchi. Gastrointestinal:   Soft and nontender. Non distended. There is no CVA tenderness.  No rebound, rigidity, or  guarding. Genitourinary:   deferred Musculoskeletal:   Normal range of motion in all extremities. No joint effusions.  No lower extremity tenderness.  No edema. Neurologic:   Slurred speech, normal language.  Motor grossly intact.  Skin:    Skin is warm, dry and intact. No rash noted.  No petechiae, purpura, or bullae.  ____________________________________________    LABS (pertinent positives/negatives) (all labs ordered are listed, but only abnormal results are displayed) Labs Reviewed  CBC - Abnormal; Notable for the following components:      Result Value   WBC 3.9 (*)    MCHC 36.8 (*)    All other components within normal limits  ETHANOL  COMPREHENSIVE METABOLIC PANEL  URINE DRUG SCREEN, QUALITATIVE ([facility] ONLY)   ____________________________________________   EKG    ____________________________________________    RADIOLOGY  No results found.  ____________________________________________   PROCEDURES Procedures  ____________________________________________    CLINICAL IMPRESSION / ASSESSMENT AND PLAN / ED COURSE  Medications ordered in the ED: Medications - No data to display  Pertinent labs & imaging results that were available during my care of the patient were reviewed by me and considered in my medical decision making (see chart for details).  COUPER JUNCAJ was evaluated in Emergency Department on 08/29/2021 for the symptoms described in the history of present illness. He was evaluated in the context of the global COVID-19 pandemic, which necessitated consideration that the patient might be at risk for infection with the SARS-CoV-2 virus that causes COVID-19. Institutional protocols and algorithms that pertain to the evaluation of patients at risk for COVID-19 are in a state of rapid change based on information released by regulatory bodies including the CDC and federal and state organizations. These policies and algorithms were followed during the  patient's care in the ED.   Patient presents with alcohol intoxication after having left a rehab program yesterday.  Vital signs are normal, he denies any acute symptoms.  Exam is unremarkable.  Doubt any significant gastritis or pancreatitis or acute liver injury.  Denies any complaints, not an imminent safety risk to himself or others, does not require urgent psychiatric evaluation. Will observe until clinically sober at which point he can be discharged .  Clinical Course as of 08/29/21 2127  Fri Aug 29, 2021  2127 Ambulatory with steady gait, clear speech, clinically sober. [PS]    Clinical Course User Index [PS] Sharman Cheek, MD     ____________________________________________   FINAL CLINICAL IMPRESSION(S) / ED DIAGNOSES    Final diagnoses:  Alcohol dependence with uncomplicated intoxication (HCC)  ED Discharge Orders     None       Portions of this note were generated with dragon dictation software. Dictation errors may occur despite best attempts at proofreading.    Sharman Cheek, MD 08/29/21 [phone]

## 2021-08-29 NOTE — ED Notes (Signed)
Pt refused snack and only ask for water

## 2021-08-29 NOTE — ED Notes (Signed)
Pt's mother's contact number:  [phone]  **Please ask patient before giving out any information**

## 2021-08-30 NOTE — ED Notes (Signed)
Patient states he is ready to be discharged.  Attempted to get patient to stay for the night as he has no wallet, phone or shoes.  Patient states "i'll be fine".  Patient provided with hospital socks and patient from 19h gave patient a jacket to wear.  Patient again encouraged to stay for the night and patient refused.

## 2021-10-07 ENCOUNTER — Emergency Department
Admission: EM | Admit: 2021-10-07 | Discharge: 2021-10-08 | Disposition: A | Discharge status: Home / Self Care | Payer: Self-pay | Attending: Emergency Medicine | Admitting: Emergency Medicine

## 2021-10-07 ENCOUNTER — Other Ambulatory Visit: Payer: Self-pay

## 2021-10-07 DIAGNOSIS — Z79899 Other long term (current) drug therapy: Secondary | ICD-10-CM | POA: Insufficient documentation

## 2021-10-07 DIAGNOSIS — F17210 Nicotine dependence, cigarettes, uncomplicated: Secondary | ICD-10-CM | POA: Insufficient documentation

## 2021-10-07 DIAGNOSIS — F10239 Alcohol dependence with withdrawal, unspecified: Secondary | ICD-10-CM | POA: Insufficient documentation

## 2021-10-07 DIAGNOSIS — Y908 Blood alcohol level of 240 mg/100 ml or more: Secondary | ICD-10-CM | POA: Insufficient documentation

## 2021-10-07 DIAGNOSIS — F10930 Alcohol use, unspecified with withdrawal, uncomplicated: Secondary | ICD-10-CM

## 2021-10-07 DIAGNOSIS — R110 Nausea: Secondary | ICD-10-CM | POA: Insufficient documentation

## 2021-10-07 LAB — COMPREHENSIVE METABOLIC PANEL
ALT: 28 U/L (ref 0–44)
AST: 50 U/L — ABNORMAL HIGH (ref 15–41)
Albumin: 5.2 g/dL — ABNORMAL HIGH (ref 3.5–5.0)
Alkaline Phosphatase: 86 U/L (ref 38–126)
Anion gap: 11 (ref 5–15)
BUN: 6 mg/dL (ref 6–20)
CO2: 28 mmol/L (ref 22–32)
Calcium: 9.4 mg/dL (ref 8.9–10.3)
Chloride: 94 mmol/L — ABNORMAL LOW (ref 98–111)
Creatinine, Ser: 0.75 mg/dL (ref 0.61–1.24)
GFR, Estimated: >60 mL/min (ref >60)
Glucose, Bld: 100 mg/dL — ABNORMAL HIGH (ref 70–99)
Potassium: 3.6 mmol/L (ref 3.5–5.1)
Sodium: 133 mmol/L — ABNORMAL LOW (ref 135–145)
Total Bilirubin: 1.1 mg/dL (ref 0.3–1.2)
Total Protein: 8.1 g/dL (ref 6.5–8.1)

## 2021-10-07 LAB — CBC WITH DIFFERENTIAL/PLATELET
Abs Immature Granulocytes: 0.02 10*3/uL (ref 0.00–0.07)
Basophils Absolute: 0 10*3/uL (ref 0.0–0.1)
Basophils Relative: 0 %
Eosinophils Absolute: 0 10*3/uL (ref 0.0–0.5)
Eosinophils Relative: 0 %
HCT: 43.9 % (ref 39.0–52.0)
Hemoglobin: 15.9 g/dL (ref 13.0–17.0)
Immature Granulocytes: 0 %
Lymphocytes Relative: 22 %
Lymphs Abs: 1.2 10*3/uL (ref 0.7–4.0)
MCH: 32.7 pg (ref 26.0–34.0)
MCHC: 36.2 g/dL — ABNORMAL HIGH (ref 30.0–36.0)
MCV: 90.3 fL (ref 80.0–100.0)
Monocytes Absolute: 0.6 10*3/uL (ref 0.1–1.0)
Monocytes Relative: 11 %
Neutro Abs: 3.4 10*3/uL (ref 1.7–7.7)
Neutrophils Relative %: 67 %
Platelets: 275 10*3/uL (ref 150–400)
RBC: 4.86 MIL/uL (ref 4.22–5.81)
RDW: 12.1 % (ref 11.5–15.5)
WBC: 5.2 10*3/uL (ref 4.0–10.5)
nRBC: 0 % (ref 0.0–0.2)

## 2021-10-07 LAB — MAGNESIUM: Magnesium: 2.2 mg/dL (ref 1.7–2.4)

## 2021-10-07 LAB — ETHANOL: Alcohol, Ethyl (B): 317 mg/dL (ref <10)

## 2021-10-07 MED ORDER — FOLIC ACID 1 MG PO TABS: 1.0000 mg | ORAL_TABLET | Freq: Every day | ORAL | Status: DC

## 2021-10-07 MED ORDER — ONDANSETRON HCL 4 MG/2ML IJ SOLN
4.0000 mg | Freq: Once | INTRAMUSCULAR | Status: AC
  Administered 2021-10-08: 4 mg via INTRAVENOUS
  Filled 2021-10-07: qty 2

## 2021-10-07 MED ORDER — LORAZEPAM 2 MG/ML IJ SOLN
1.0000 mg | INTRAMUSCULAR | Status: DC | PRN
  Administered 2021-10-07: 2 mg via INTRAVENOUS
  Filled 2021-10-07: qty 1

## 2021-10-07 MED ORDER — THIAMINE HCL 100 MG PO TABS: 100.0000 mg | ORAL_TABLET | Freq: Every day | ORAL | Status: DC

## 2021-10-07 MED ORDER — CLONAZEPAM 0.5 MG PO TABS
0.5000 mg | ORAL_TABLET | Freq: Once | ORAL | Status: AC
  Administered 2021-10-07: 0.5 mg via ORAL
  Filled 2021-10-07: qty 1

## 2021-10-07 MED ORDER — THIAMINE HCL 100 MG/ML IJ SOLN: 100.0000 mg | Freq: Every day | INTRAMUSCULAR | Status: DC

## 2021-10-07 MED ORDER — LACTATED RINGERS IV BOLUS
1000.0000 mL | Freq: Once | INTRAVENOUS | Status: AC
  Administered 2021-10-08: 1000 mL via INTRAVENOUS

## 2021-10-07 MED ORDER — ADULT MULTIVITAMIN W/MINERALS CH: 1.0000 | ORAL_TABLET | Freq: Every day | ORAL | Status: DC

## 2021-10-07 MED ORDER — CHLORDIAZEPOXIDE HCL 25 MG PO CAPS
25.0000 mg | ORAL_CAPSULE | Freq: Once | ORAL | Status: AC
Start: 2021-10-07 — End: 2021-10-08
  Administered 2021-10-08: 25 mg via ORAL
  Filled 2021-10-07: qty 1

## 2021-10-07 MED ORDER — LORAZEPAM 1 MG PO TABS: 1.0000 mg | ORAL_TABLET | ORAL | Status: DC | PRN

## 2021-10-07 NOTE — ED Notes (Signed)
Patient approached this RN and asked about his plan of care Patient states he is very anxious, is beginning to have tremors, and is not "feeling good". Patient made aware that he is waiting to be seen by a provider prior to any meds being ordered Patient made aware that providers are changing shift in a few minutes and he may need to wait for oncoming doc to be seen/evaluated Patient provided new cup of ice water and asked to wait in his bed for an update Patient has no further needs at this time Patient is AxOx4, ambulatory without assistance

## 2021-10-07 NOTE — ED Notes (Signed)
PIV placed in patient's lower right arm, purple and green top tubes sent to lab as save tubes to be held Patient medicated per MD orders and CIWA scale charted, please see flowsheets for details Patient aware this RN not his primary RN but that primary RN will be around to see patient shortly Patient aware of plan of care, no further needs at this time Patient is AxOx4

## 2021-10-07 NOTE — ED Triage Notes (Addendum)
Pt to ED for ETOH detox. Last drink at noon. Brought over by [facility] for clearance to [facility] for detox. Denies SI/HI No tremors or sweating noted.

## 2021-10-07 NOTE — ED Notes (Signed)
Pyxis messing up. Pharmacy to send klonopin

## 2021-10-07 NOTE — ED Provider Notes (Signed)
[facility] Emergency Department Provider Note   ____________________________________________   Event Date/Time   First MD Initiated Contact with Patient 10/07/21 2259     (approximate)  I have reviewed the triage vital signs and the nursing notes.   HISTORY  Chief Complaint detox    HPI Robert Briggs is a 25 y.o. male with past medical history of alcohol abuse who presents to the ED requesting detox.  Patient reports that he has been "drinking too much" and been on a binge for the past 3 days.  He reports drinking as many as 18 beers a day, often with about a fifth of liquor as well.  He states has been "feeling bad" for the past couple of days with some nausea but no vomiting, has not eaten much over this time.  He denies any fevers, abdominal pain, or flank pain.  His last drink was around 1 PM this afternoon and he now feels like he is developing symptoms of withdrawal.  He initially presented to [facility], was told to come to the ED for medical clearance and potential detox.  He denies any suicidal homicidal ideation.        Past Medical History:  Diagnosis Date   Alcohol abuse     Patient Active Problem List   Diagnosis Date Noted   Generalized anxiety disorder 04/15/2021   Panic attacks 04/15/2021   Alcohol abuse 04/15/2021    History reviewed. No pertinent surgical history.  Prior to Admission medications   Medication Sig Start Date End Date Taking? Authorizing Provider  chlordiazePOXIDE (LIBRIUM) 25 MG capsule Take 1 capsule (25 mg total) by mouth 5 (five) times daily for 1 day, THEN 1 capsule (25 mg total) 4 (four) times daily for 1 day, THEN 1 capsule (25 mg total) 3 (three) times daily for 1 day, THEN 1 capsule (25 mg total) 2 (two) times daily for 1 day, THEN 1 capsule (25 mg total) daily for 1 day. 10/08/21 10/13/21 Yes Chesley Noon, MD  ondansetron (ZOFRAN-ODT) 4 MG disintegrating tablet Take 1 tablet (4 mg total) by mouth every 8  (eight) hours as needed for nausea or vomiting. 10/08/21  Yes Chesley Noon, MD  FLUoxetine (PROZAC) 20 MG capsule Take 1 capsule (20 mg total) by mouth daily. 04/15/21 07/14/21  Clapacs, Jackquline Denmark, MD  gabapentin (NEURONTIN) 100 MG capsule Take 1 capsule (100 mg total) by mouth 2 (two) times daily for 14 days. 05/17/21 05/31/21  White, Patrice L, NP  LORazepam (ATIVAN) 1 MG tablet Ativan 2 mg tablets. Take 1 Ativan 2 mg tablet tonight. Take 1 Ativan 2 mg tablet 3 times a day tomorrow or every 8 hours. Take 1 Ativan 2 mg tablet twice a day or every 12 hours on July 12. Dispense 6 Ativan tablets 05/18/21   Arnaldo Natal, MD    Allergies Patient has no known allergies.  History reviewed. No pertinent family history.  Social History Social History   Tobacco Use   Smoking status: Every Day    Types: Cigarettes   Smokeless tobacco: Never  Vaping Use   Vaping Use: Never used  Substance Use Topics   Alcohol use: Yes    Comment: daily   Drug use: Not Currently    Review of Systems  Constitutional: No fever/chills.  Positive for malaise. Eyes: No visual changes. ENT: No sore throat. Cardiovascular: Denies chest pain. Respiratory: Denies shortness of breath. Gastrointestinal: No abdominal pain.  Positive for nausea, no vomiting.  No  diarrhea.  No constipation. Genitourinary: Negative for dysuria. Musculoskeletal: Negative for back pain. Skin: Negative for rash. Neurological: Negative for headaches, focal weakness or numbness.  ____________________________________________   PHYSICAL EXAM:  VITAL SIGNS: ED Triage Vitals  Enc Vitals Group     BP 10/07/21 1655 134/88     Pulse Rate 10/07/21 1655 (!) 118     Resp 10/07/21 1655 18     Temp 10/07/21 1655 97.9 F (36.6 C)     Temp Source 10/07/21 1655 Oral     SpO2 10/07/21 1655 95 %     Weight 10/07/21 1657 140 lb (63.5 kg)     Height 10/07/21 1657 6\' 2"  (1.88 m)     Head Circumference --      Peak Flow --      Pain Score  10/07/21 1656 5     Pain Loc --      Pain Edu? --      Excl. in GC? --     Constitutional: Alert and oriented. Eyes: Conjunctivae are normal. Head: Atraumatic. Nose: No congestion/rhinnorhea. Mouth/Throat: Mucous membranes are moist. Neck: Normal ROM Cardiovascular: Tachycardic, regular rhythm. Grossly normal heart sounds.  2+ radial pulses bilaterally. Respiratory: Normal respiratory effort.  No retractions. Lungs CTAB. Gastrointestinal: Soft and nontender. No distention. Genitourinary: deferred Musculoskeletal: No lower extremity tenderness nor edema. Neurologic:  Normal speech and language. No gross focal neurologic deficits are appreciated. Skin:  Skin is warm, dry and intact. No rash noted. Psychiatric: Mood and affect are normal. Speech and behavior are normal.  ____________________________________________   LABS (all labs ordered are listed, but only abnormal results are displayed)  Labs Reviewed  CBC WITH DIFFERENTIAL/PLATELET - Abnormal; Notable for the following components:      Result Value   MCHC 36.2 (*)    All other components within normal limits  COMPREHENSIVE METABOLIC PANEL - Abnormal; Notable for the following components:   Sodium 133 (*)    Chloride 94 (*)    Glucose, Bld 100 (*)    Albumin 5.2 (*)    AST 50 (*)    All other components within normal limits  ETHANOL - Abnormal; Notable for the following components:   Alcohol, Ethyl (B) 317 (*)    All other components within normal limits  MAGNESIUM  URINE DRUG SCREEN, QUALITATIVE ([facility] ONLY)     PROCEDURES  Procedure(s) performed (including Critical Care):  Procedures   ____________________________________________   INITIAL IMPRESSION / ASSESSMENT AND PLAN / ED COURSE      25 year old male with past medical history of alcohol abuse presents to the ED requesting detox after drinking as much as 18 beers and a fifth of liquor daily.  He now appears to be having mild withdrawal symptoms with  tachycardia but no signs of DTs.  We will give dose of Librium and start on CIWA protocol, hydrate with IV fluids and give dose of Zofran.  Labs are pending to assess for electrolyte abnormality, we will also consult [facility] for potential detox placement.  Labs markable for elevated blood alcohol but otherwise reassuring with no significant electrolyte abnormality.  Patient reports withdrawal symptoms are improved following dose of Ativan and Librium.  He was evaluated by TTS and will follow up tomorrow with residential treatment services.  We will prescribe Librium taper for management of withdrawal symptoms along with Zofran.  Patient counseled to return to the ED for new worsening symptoms, patient agrees with plan.      ____________________________________________   FINAL CLINICAL  IMPRESSION(S) / ED DIAGNOSES  Final diagnoses:  Alcohol withdrawal syndrome without complication [facility])     ED Discharge Orders          Ordered    chlordiazePOXIDE (LIBRIUM) 25 MG capsule        10/08/21 0206    ondansetron (ZOFRAN-ODT) 4 MG disintegrating tablet  Every 8 hours PRN        10/08/21 0206             Note:  This document was prepared using Dragon voice recognition software and may include unintentional dictation errors.    Chesley Noon, MD 10/08/21 [phone]

## 2021-10-08 MED ORDER — ONDANSETRON 4 MG PO TBDP: 4.0000 mg | ORAL_TABLET | Freq: Three times a day (TID) | ORAL | 0 refills | Status: DC | PRN

## 2021-10-08 MED ORDER — CHLORDIAZEPOXIDE HCL 25 MG PO CAPS: ORAL_CAPSULE | ORAL | 0 refills | Status: AC

## 2021-10-08 NOTE — BH Assessment (Addendum)
Referral information for Detox treatment faxed to;   [facility]  [phone]) Patient completed his pre-screening via phone with [facility] staff Meg  [facility] Staff reports that patient can follow-up later this morning after 8am. Patient reports that he would like to follow-up on his own and reports that he has the phone number to the facility. No further needs from [facility]TS at this time. Communicated this to EDP Dr. Larinda Buttery

## 2021-10-08 NOTE — BH Assessment (Signed)
Comprehensive Clinical Assessment (CCA) Note  10/08/2021 Robert Briggs [id number]  Chief Complaint: Patient is a 25 year old male presenting to [facility] ED voluntarily for detox. Per triage note Pt to ED for ETOH detox. Last drink at noon. Brought over by [facility] for clearance to [facility] for detox. Denies SI/HI. No tremors or sweating noted. During assessment patient appears alert and oriented x4, calm and cooperative. Patient reports that he drinks an "18 pack of beer and a couple of shots "daily. Patient reports his age of first use being 40. Patient reports that he does not have any sobriety. Patient reports current withdrawals as "nausea, vomiting, sweats, and chills." Patient denies any seizures. Patient reports a lack of sleep and no appetite, currently living with his parents and is in and out of work. Patient reports having detox treatment with [facility] before in the past and wishes to return. Patient denies SI/HI/AH/VH Chief Complaint  Patient presents with   detox   Visit Diagnosis: Alcohol Use Disorder, severe    CCA Screening, Triage and Referral (STR)  Patient Reported Information How did you hear about [place]? Self  Referral name: No data recorded Referral phone number: No data recorded  Whom do you see for routine medical problems? No data recorded Practice/Facility Name: No data recorded Practice/Facility Phone Number: No data recorded Name of Contact: No data recorded Contact Number: No data recorded Contact Fax Number: No data recorded Prescriber Name: No data recorded Prescriber Address (if known): No data recorded  What Is the Reason for Your Visit/Call Today? Patient presents voluntarily requesting detox services  How Long Has This Been Causing You Problems? > than 6 months  What Do You Feel Would Help You the Most Today? Alcohol or Drug Use Treatment   Have You Recently Been in Any Inpatient Treatment (Hospital/Detox/Crisis Center/28-Day Program)? No  Name/Location of  Program/Hospital:No data recorded How Long Were You There? No data recorded When Were You Discharged? No data recorded  Have You Ever Received Services From [facility] Before? No  Who Do You See at [facility]? No data recorded  Have You Recently Had Any Thoughts About Hurting Yourself? No  Are You Planning to Commit Suicide/Harm Yourself At This time? No   Have you Recently Had Thoughts About Hurting Someone Karolee Ohs? No  Explanation: No data recorded  Have You Used Any Alcohol or Drugs in the Past 24 Hours? Yes  How Long Ago Did You Use Drugs or Alcohol? No data recorded What Did You Use and How Much? Alcohol, 18 pack of beer "and a couple of shots"   Do You Currently Have a Therapist/Psychiatrist? No  Name of Therapist/Psychiatrist: No data recorded  Have You Been Recently Discharged From Any Office Practice or Programs? No  Explanation of Discharge From Practice/Program: No data recorded    CCA Screening Triage Referral Assessment Type of Contact: Face-to-Face  Is this Initial or Reassessment? Initial Assessment  Date Telepsych consult ordered in [facility]:  05/18/21  Time Telepsych consult ordered in [facility]:  No data recorded  Patient Reported Information Reviewed? Yes  Patient Left Without Being Seen? No data recorded Reason for Not Completing Assessment: No data recorded  Collateral Involvement: None provided   Does Patient Have a Court Appointed Legal Guardian? No data recorded Name and Contact of Legal Guardian: No data recorded If Minor and Not Living with Parent(s), Who has Custody? n/a  Is CPS involved or ever been involved? Never  Is APS involved or ever been involved? Never  Patient Determined To Be At Risk for Harm To Self or Others Based on Review of Patient Reported Information or Presenting Complaint? No  Method: No Plan  Availability of Means: No access or NA  Intent: No data recorded Notification Required: No need or identified  person  Additional Information for Danger to Others Potential: No data recorded Additional Comments for Danger to Others Potential: No data recorded Are There Guns or Other Weapons in Your Home? No  Types of Guns/Weapons: No data recorded Are These Weapons Safely Secured?                            No data recorded Who Could Verify You Are Able To Have These Secured: No data recorded Do You Have any Outstanding Charges, Pending Court Dates, Parole/Probation? No data recorded Contacted To Inform of Risk of Harm To Self or Others: No data recorded  Location of Assessment: [facility] ED   Does Patient Present under Involuntary Commitment? No  IVC Papers Initial File Date: No data recorded  [place] of Residence: [place]   Patient Currently Receiving the Following Services: Not Receiving Services   Determination of Need: Emergent (2 hours)   Options For Referral: Other: Comment (Pt has an appointment with [facility])     CCA Biopsychosocial Intake/Chief Complaint:  Patient states he is ready to stop drinking. "I want to be myself."  Current Symptoms/Problems: Tremors   Patient Reported Schizophrenia/Schizoaffective Diagnosis in Past: No   Strengths: Patient is able to communicate his needs  Preferences: No data recorded Abilities: No data recorded  Type of Services Patient Feels are Needed: No data recorded  Initial Clinical Notes/Concerns: No data recorded  Mental Health Symptoms Depression:   Change in energy/activity   Duration of Depressive symptoms:  Greater than two weeks   Mania:   None   Anxiety:    Difficulty concentrating; Restlessness   Psychosis:   None   Duration of Psychotic symptoms: No data recorded  Trauma:   None   Obsessions:   None   Compulsions:   None   Inattention:   None   Hyperactivity/Impulsivity:   None   Oppositional/Defiant Behaviors:   None   Emotional Irregularity:   None   Other Mood/Personality Symptoms:   Severe  anxiety    Mental Status Exam Appearance and self-care  Stature:   Average   Weight:   Average weight   Clothing:   Casual   Grooming:   Normal   Cosmetic use:   None   Posture/gait:   Normal   Motor activity:   Not Remarkable   Sensorium  Attention:   Normal   Concentration:   Normal   Orientation:   X5   Recall/memory:   Normal   Affect and Mood  Affect:   Appropriate   Mood:   Anxious   Relating  Eye contact:   Normal   Facial expression:   Responsive   Attitude toward examiner:   Cooperative   Thought and Language  Speech flow:  Clear and Coherent   Thought content:   Appropriate to Mood and Circumstances   Preoccupation:   None   Hallucinations:   None   Organization:  No data recorded  [employer] of Knowledge:   Fair   Intelligence:   Average   Abstraction:   Normal   Judgement:   Fair   Art therapist:   Realistic   Insight:  Good   Decision Making:   Normal   Social Functioning  Social Maturity:   Responsible   Social Judgement:   Normal   Stress  Stressors:   Teacher, music Ability:   Advice worker Deficits:   None   Supports:   Family     Religion: Religion/Spirituality Are You A Religious Person?: No  Leisure/Recreation: Leisure / Recreation Do You Have Hobbies?: No  Exercise/Diet: Exercise/Diet Do You Exercise?: No Have You Gained or Lost A Significant Amount of Weight in the Past Six Months?: No Do You Follow a Special Diet?: No Do You Have Any Trouble Sleeping?: Yes Explanation of Sleeping Difficulties: Patient reports poor sleep   CCA Employment/Education Employment/Work Situation: Employment / Work Situation Employment Situation: Unemployed Patient's Job has Been Impacted by Current Illness: No Has Patient ever Been in Passenger transport manager?: No  Education: Education Is Patient Currently Attending School?: No Did You Have An Individualized  Education Program (IIEP): No Did You Have Any Difficulty At [employer]?: No Patient's Education Has Been Impacted by Current Illness: No   CCA Family/Childhood History Family and Relationship History: Family history Marital status: Single Does patient have children?: No  Childhood History:  Childhood History By whom was/is the patient raised?: Both parents Did patient suffer any verbal/emotional/physical/sexual abuse as a child?: No Did patient suffer from severe childhood neglect?: No Has patient ever been sexually abused/assaulted/raped as an adolescent or adult?: No Was the patient ever a victim of a crime or a disaster?: No Witnessed domestic violence?: No Has patient been affected by domestic violence as an adult?: No  Child/Adolescent Assessment:     CCA Substance Use Alcohol/Drug Use: Alcohol / Drug Use Pain Medications: See MAR Prescriptions: See MAR Over the Counter: See MAR History of alcohol / drug use?: Yes Longest period of sobriety (when/how long): Patient reports not having any sobriet Negative Consequences of Use: Financial, Personal relationships Withdrawal Symptoms: Change in blood pressure, Nausea / Vomiting, Patient aware of relationship between substance abuse and physical/medical complications, Tremors, Sweats Substance #1 Name of Substance 1: Alcohol 1 - Age of First Use: 16 1 - Amount (size/oz): "18 pack of beer and a couple of shots" 1 - Frequency: daily 1 - Duration: 8 years 1 - Last Use / Amount: 10/07/21 1 - Method of Aquiring: Unknown 1- Route of Use: Oral                       ASAM's:  Six Dimensions of Multidimensional Assessment  Dimension 1:  Acute Intoxication and/or Withdrawal Potential:      Dimension 2:  Biomedical Conditions and Complications:      Dimension 3:  Emotional, Behavioral, or Cognitive Conditions and Complications:     Dimension 4:  Readiness to Change:     Dimension 5:  Relapse, Continued use, or Continued  Problem Potential:     Dimension 6:  Recovery/Living Environment:     ASAM Severity Score:    ASAM Recommended Level of Treatment:     Substance use Disorder (SUD) Substance Use Disorder (SUD)  Checklist Symptoms of Substance Use: Continued use despite having a persistent/recurrent physical/psychological problem caused/exacerbated by use, Evidence of tolerance, Presence of craving or strong urge to use, Social, occupational, recreational activities given up or reduced due to use, Evidence of withdrawal (Comment), Recurrent use that results in a failure to fulfill major role obligations (work, school, home), Large amounts of time spent to obtain, use or recover  from the substance(s), Substance(s) often taken in larger amounts or over longer times than was intended, Continued use despite persistent or recurrent social, interpersonal problems, caused or exacerbated by use, Persistent desire or unsuccessful efforts to cut down or control use, Repeated use in physically hazardous situations  Recommendations for Services/Supports/Treatments: Recommendations for Services/Supports/Treatments Recommendations For Services/Supports/Treatments: Detox  DSM5 Diagnoses: Patient Active Problem List   Diagnosis Date Noted   Generalized anxiety disorder 04/15/2021   Panic attacks 04/15/2021   Alcohol abuse 04/15/2021    Patient Centered Plan: Patient is on the following Treatment Plan(s):  Anxiety and Substance Abuse   Referrals to Alternative Service(s): Referred to Alternative Service(s):   Place:   Date:   Time:    Referred to Alternative Service(s):   Place:   Date:   Time:    Referred to Alternative Service(s):   Place:   Date:   Time:    Referred to Alternative Service(s):   Place:   Date:   Time:     Brenn Gatton A Suetta Hoffmeister, LCAS-A

## 2021-10-25 ENCOUNTER — Other Ambulatory Visit: Payer: Self-pay

## 2021-10-25 ENCOUNTER — Encounter: Payer: Self-pay | Admitting: Emergency Medicine

## 2021-10-25 ENCOUNTER — Emergency Department
Admission: EM | Admit: 2021-10-25 | Discharge: 2021-10-25 | Disposition: A | Discharge status: Home / Self Care | Payer: Self-pay | Attending: Student in an Organized Health Care Education/Training Program | Admitting: Student in an Organized Health Care Education/Training Program

## 2021-10-25 DIAGNOSIS — F10920 Alcohol use, unspecified with intoxication, uncomplicated: Secondary | ICD-10-CM

## 2021-10-25 DIAGNOSIS — F10120 Alcohol abuse with intoxication, uncomplicated: Secondary | ICD-10-CM | POA: Insufficient documentation

## 2021-10-25 DIAGNOSIS — F17210 Nicotine dependence, cigarettes, uncomplicated: Secondary | ICD-10-CM | POA: Insufficient documentation

## 2021-10-25 DIAGNOSIS — Y908 Blood alcohol level of 240 mg/100 ml or more: Secondary | ICD-10-CM | POA: Insufficient documentation

## 2021-10-25 LAB — CBC WITH DIFFERENTIAL/PLATELET
Abs Immature Granulocytes: 0.03 10*3/uL (ref 0.00–0.07)
Basophils Absolute: 0 10*3/uL (ref 0.0–0.1)
Basophils Relative: 1 %
Eosinophils Absolute: 0 10*3/uL (ref 0.0–0.5)
Eosinophils Relative: 0 %
HCT: 43.7 % (ref 39.0–52.0)
Hemoglobin: 15.2 g/dL (ref 13.0–17.0)
Immature Granulocytes: 1 %
Lymphocytes Relative: 14 %
Lymphs Abs: 0.9 10*3/uL (ref 0.7–4.0)
MCH: 33.1 pg (ref 26.0–34.0)
MCHC: 34.8 g/dL (ref 30.0–36.0)
MCV: 95.2 fL (ref 80.0–100.0)
Monocytes Absolute: 0.5 10*3/uL (ref 0.1–1.0)
Monocytes Relative: 8 %
Neutro Abs: 5.2 10*3/uL (ref 1.7–7.7)
Neutrophils Relative %: 76 %
Platelets: 234 10*3/uL (ref 150–400)
RBC: 4.59 MIL/uL (ref 4.22–5.81)
RDW: 12.4 % (ref 11.5–15.5)
WBC: 6.7 10*3/uL (ref 4.0–10.5)
nRBC: 0 % (ref 0.0–0.2)

## 2021-10-25 LAB — COMPREHENSIVE METABOLIC PANEL
ALT: 20 U/L (ref 0–44)
AST: 31 U/L (ref 15–41)
Albumin: 4.3 g/dL (ref 3.5–5.0)
Alkaline Phosphatase: 77 U/L (ref 38–126)
Anion gap: 8 (ref 5–15)
BUN: <5 mg/dL — ABNORMAL LOW (ref 6–20)
CO2: 30 mmol/L (ref 22–32)
Calcium: 9 mg/dL (ref 8.9–10.3)
Chloride: 103 mmol/L (ref 98–111)
Creatinine, Ser: 0.64 mg/dL (ref 0.61–1.24)
GFR, Estimated: >60 mL/min (ref >60)
Glucose, Bld: 105 mg/dL — ABNORMAL HIGH (ref 70–99)
Potassium: 3.9 mmol/L (ref 3.5–5.1)
Sodium: 141 mmol/L (ref 135–145)
Total Bilirubin: 0.6 mg/dL (ref 0.3–1.2)
Total Protein: 7.4 g/dL (ref 6.5–8.1)

## 2021-10-25 LAB — SALICYLATE LEVEL: Salicylate Lvl: <7 mg/dL — ABNORMAL LOW (ref 7.0–30.0)

## 2021-10-25 LAB — ETHANOL: Alcohol, Ethyl (B): 381 mg/dL (ref <10)

## 2021-10-25 LAB — ACETAMINOPHEN LEVEL: Acetaminophen (Tylenol), Serum: <10 ug/mL — ABNORMAL LOW (ref 10–30)

## 2021-10-25 MED ORDER — ADULT MULTIVITAMIN W/MINERALS CH
1.0000 | ORAL_TABLET | Freq: Once | ORAL | Status: AC
  Administered 2021-10-25: 1 via ORAL
  Filled 2021-10-25: qty 1

## 2021-10-25 MED ORDER — THIAMINE HCL 100 MG/ML IJ SOLN
100.0000 mg | Freq: Once | INTRAMUSCULAR | Status: AC
  Administered 2021-10-25: 100 mg via INTRAVENOUS
  Filled 2021-10-25: qty 2

## 2021-10-25 MED ORDER — SODIUM CHLORIDE 0.9 % IV BOLUS
1000.0000 mL | Freq: Once | INTRAVENOUS | Status: AC
  Administered 2021-10-25: 1000 mL via INTRAVENOUS

## 2021-10-25 MED ORDER — ONDANSETRON 4 MG PO TBDP
4.0000 mg | ORAL_TABLET | Freq: Once | ORAL | Status: AC
  Administered 2021-10-25: 4 mg via ORAL
  Filled 2021-10-25: qty 1

## 2021-10-25 NOTE — ED Provider Notes (Signed)
[facility] Emergency Department Provider Note   ____________________________________________   Event Date/Time   First MD Initiated Contact with Patient 10/25/21 1847     (approximate)  I have reviewed the triage vital signs and the nursing notes.   HISTORY  Chief Complaint Alcohol Intoxication    HPI Robert Briggs is a 25 y.o. male patient reports he drank a lot of beer.  Also liquor.  He said he has been drinking a lot and wants some help getting off.  He is feeling somewhat better than he was but still a headachy.  He asked for something to drink.        Past Medical History:  Diagnosis Date   Alcohol abuse     Patient Active Problem List   Diagnosis Date Noted   Generalized anxiety disorder 04/15/2021   Panic attacks 04/15/2021   Alcohol abuse 04/15/2021    History reviewed. No pertinent surgical history.  Prior to Admission medications   Medication Sig Start Date End Date Taking? Authorizing Provider  FLUoxetine (PROZAC) 20 MG capsule Take 1 capsule (20 mg total) by mouth daily. 04/15/21 07/14/21  Clapacs, Jackquline Denmark, MD  gabapentin (NEURONTIN) 100 MG capsule Take 1 capsule (100 mg total) by mouth 2 (two) times daily for 14 days. 05/17/21 05/31/21  White, Patrice L, NP  LORazepam (ATIVAN) 1 MG tablet Ativan 2 mg tablets. Take 1 Ativan 2 mg tablet tonight. Take 1 Ativan 2 mg tablet 3 times a day tomorrow or every 8 hours. Take 1 Ativan 2 mg tablet twice a day or every 12 hours on July 12. Dispense 6 Ativan tablets 05/18/21   Arnaldo Natal, MD  ondansetron (ZOFRAN-ODT) 4 MG disintegrating tablet Take 1 tablet (4 mg total) by mouth every 8 (eight) hours as needed for nausea or vomiting. 10/08/21   Chesley Noon, MD    Allergies Patient has no known allergies.  History reviewed. No pertinent family history.  Social History Social History   Tobacco Use   Smoking status: Every Day    Types: Cigarettes   Smokeless tobacco: Never   Vaping Use   Vaping Use: Never used  Substance Use Topics   Alcohol use: Yes    Comment: daily   Drug use: Not Currently    Review of Systems  Constitutional: No fever/chills Eyes: No visual changes. ENT: No sore throat. Cardiovascular: Denies chest pain. Respiratory: Denies shortness of breath. Gastrointestinal: No abdominal pain.   nausea, no vomiting.  No diarrhea.  No constipation. Genitourinary: Negative for dysuria. Musculoskeletal: Negative for back pain. Skin: Negative for rash. Neurological: Negative for headaches, focal weakness    ____________________________________________   PHYSICAL EXAM:  VITAL SIGNS: ED Triage Vitals  Enc Vitals Group     BP 10/25/21 1846 126/85     Pulse Rate 10/25/21 1846 82     Resp 10/25/21 1846 20     Temp 10/25/21 1846 98.5 F (36.9 C)     Temp Source 10/25/21 1846 Oral     SpO2 10/25/21 1846 97 %     Weight 10/25/21 1843 150 lb (68 kg)     Height 10/25/21 1843 6\' 3"  (1.905 m)     Head Circumference --      Peak Flow --      Pain Score 10/25/21 1842 0     Pain Loc --      Pain Edu? --      Excl. in GC? --     Constitutional:  Alert and oriented.  Somewhat sleepy wakes up easily Eyes: Conjunctivae are normal. PER EOMI. Head: Atraumatic. Nose: No congestion/rhinnorhea. Mouth/Throat: Mucous membranes are moist.  Oropharynx non-erythematous. Neck: No stridor. Cardiovascular: Normal rate, regular rhythm. Grossly normal heart sounds.  Good peripheral circulation. Respiratory: Normal respiratory effort.  No retractions. Lungs CTAB. Gastrointestinal: Soft and nontender. No distention. No abdominal bruits.  Musculoskeletal: No lower extremity tenderness nor edema. . Neurologic:  Normal speech and language. No gross focal neurologic deficits are appreciated. Skin:  Skin is warm, dry and intact. No rash noted.   ____________________________________________   LABS (all labs ordered are listed, but only abnormal results are  displayed)  Labs Reviewed  COMPREHENSIVE METABOLIC PANEL - Abnormal; Notable for the following components:      Result Value   Glucose, Bld 105 (*)    BUN <5 (*)    All other components within normal limits  ETHANOL - Abnormal; Notable for the following components:   Alcohol, Ethyl (B) 381 (*)    All other components within normal limits  ACETAMINOPHEN LEVEL - Abnormal; Notable for the following components:   Acetaminophen (Tylenol), Serum <10 (*)    All other components within normal limits  SALICYLATE LEVEL - Abnormal; Notable for the following components:   Salicylate Lvl <7.0 (*)    All other components within normal limits  CBC WITH DIFFERENTIAL/PLATELET   ____________________________________________  EKG   ____________________________________________  RADIOLOGY Jill Poling, personally viewed and evaluated these images (plain radiographs) as part of my medical decision making, as well as reviewing the written report by the radiologist.  ED MD interpretation:    Official radiology report(s): No results found.  ____________________________________________   PROCEDURES  Procedure(s) performed (including Critical Care):  Procedures   ____________________________________________   INITIAL IMPRESSION / ASSESSMENT AND PLAN / ED COURSE  Patient with history of anxiety generalized anxiety disorder.  Asked for something for this.  When he is slightly more sober I will get him some Ativan.    ----------------------------------------- 10:51 PM on 10/25/2021 ----------------------------------------- Patient is now clinically sober.  He can walk normally bend over backwards with his eyes closed normally talk normally he is not having any loss of coordination.  He wants to detox at home.  TTS is come by given him information about places to go for detox and rehab if he needs it.  He will get a ride home.           ____________________________________________   FINAL CLINICAL IMPRESSION(S) / ED DIAGNOSES  Final diagnoses:  Alcoholic intoxication without complication [facility])     ED Discharge Orders     None        Note:  This document was prepared using Dragon voice recognition software and may include unintentional dictation errors.    Arnaldo Natal, MD 10/25/21 2251

## 2021-10-25 NOTE — Discharge Instructions (Addendum)
Please try to see if you can get off the alcohol or at least decrease the amount you are drinking.  In the long-term it will have very serious effect on your liver and if you drink for many years on your brain.  If you need any help do not hesitate to return.

## 2021-10-25 NOTE — ED Notes (Signed)
Patient verbalizes understanding of discharge instructions. Opportunity for questioning and answers were provided. Armband removed by staff, pt discharged from ED. Ambulated out to lobby, ride awaiting outside  

## 2021-10-25 NOTE — ED Triage Notes (Signed)
Pt via EMS from home. Pt here for alcohol intoxication pt has been on a 5-day binge drinking approx 24 pack a day. States he was just being stupid. Denies SOB/CP but endorses nausea. Pt is A&OX4 and NAD.

## 2021-10-25 NOTE — ED Provider Notes (Signed)
Emergency Medicine Provider Triage Evaluation Note  Robert Briggs , a 25 y.o. male  was evaluated in triage.  Pt complains of headache, dizziness and nausea. He reports he has drank 24 beers and some liquor daily for the last 5 days.  Review of Systems  Positive: Headache, dizziness, nausea Negative: Vision changes, chest pain, shortness of breath, vomiting, diarrhea  Physical Exam  Ht 6\' 3"  (1.905 m)    Wt 68 kg    BMI 18.75 kg/m  Gen:   Asleep, arouses easily Resp:  Normal effort  Cardio:  RRR, no murmur Neuro:  Intoxicated, slurred speech Medical Decision Making  Medically screening exam initiated at 6:45 PM.  Appropriate orders placed.  Robert Briggs was informed that the remainder of the evaluation will be completed by another provider, this initial triage assessment does not replace that evaluation, and the importance of remaining in the ED until their evaluation is complete.  Alcohol Intoxication:  CBC, BMET, ETOH   Genia Plants, NP 10/25/21 1851    10/27/21, MD 10/25/21 1946

## 2021-10-25 NOTE — ED Triage Notes (Signed)
Pt in via EMS from home with c/o alcohol intoxication. Pt has been on a bender for 5 days drinking and 24 pack every day for 5 days. # 18 g to left AC, pt was given of LR and of NS, 140/90, HR 88, 96% RA, CBG 132

## 2021-11-17 ENCOUNTER — Other Ambulatory Visit: Payer: Self-pay

## 2021-11-17 ENCOUNTER — Ambulatory Visit: Payer: Self-pay | Admitting: Family Medicine

## 2021-11-17 DIAGNOSIS — Z111 Encounter for screening for respiratory tuberculosis: Secondary | ICD-10-CM

## 2021-11-18 NOTE — Progress Notes (Signed)
Patient in clinic for PPD.  Placed on L FA.  Instructions given.  Patient verbalized understanding.  PPDR appointment given.  Patient to call if questions or concerns.    Wendi Snipes, FNP

## 2021-11-20 ENCOUNTER — Ambulatory Visit (LOCAL_COMMUNITY_HEALTH_CENTER): Payer: Self-pay

## 2021-11-20 ENCOUNTER — Other Ambulatory Visit: Payer: Self-pay

## 2021-11-20 DIAGNOSIS — Z111 Encounter for screening for respiratory tuberculosis: Secondary | ICD-10-CM

## 2021-11-20 LAB — TB SKIN TEST
Induration: 0 mm
TB Skin Test: NEGATIVE

## 2021-11-20 NOTE — Progress Notes (Signed)
PPDR Negative 0 mm. Cristella Stiver, RN  

## 2022-02-10 ENCOUNTER — Emergency Department
Admission: EM | Admit: 2022-02-10 | Discharge: 2022-02-10 | Disposition: A | Discharge status: Home / Self Care | Payer: Self-pay | Attending: Emergency Medicine | Admitting: Emergency Medicine

## 2022-02-10 ENCOUNTER — Emergency Department: Payer: Self-pay

## 2022-02-10 DIAGNOSIS — F10220 Alcohol dependence with intoxication, uncomplicated: Secondary | ICD-10-CM | POA: Insufficient documentation

## 2022-02-10 DIAGNOSIS — F10920 Alcohol use, unspecified with intoxication, uncomplicated: Secondary | ICD-10-CM

## 2022-02-10 DIAGNOSIS — Y908 Blood alcohol level of 240 mg/100 ml or more: Secondary | ICD-10-CM | POA: Insufficient documentation

## 2022-02-10 LAB — COMPREHENSIVE METABOLIC PANEL
ALT: 15 U/L (ref 0–44)
AST: 18 U/L (ref 15–41)
Albumin: 4.4 g/dL (ref 3.5–5.0)
Alkaline Phosphatase: 83 U/L (ref 38–126)
Anion gap: 11 (ref 5–15)
BUN: 7 mg/dL (ref 6–20)
CO2: 25 mmol/L (ref 22–32)
Calcium: 8.5 mg/dL — ABNORMAL LOW (ref 8.9–10.3)
Chloride: 102 mmol/L (ref 98–111)
Creatinine, Ser: 0.81 mg/dL (ref 0.61–1.24)
GFR, Estimated: >60 mL/min (ref >60)
Glucose, Bld: 125 mg/dL — ABNORMAL HIGH (ref 70–99)
Potassium: 3.3 mmol/L — ABNORMAL LOW (ref 3.5–5.1)
Sodium: 138 mmol/L (ref 135–145)
Total Bilirubin: 0.4 mg/dL (ref 0.3–1.2)
Total Protein: 7.4 g/dL (ref 6.5–8.1)

## 2022-02-10 LAB — CBC WITH DIFFERENTIAL/PLATELET
Abs Immature Granulocytes: 0.01 10*3/uL (ref 0.00–0.07)
Basophils Absolute: 0 10*3/uL (ref 0.0–0.1)
Basophils Relative: 0 %
Eosinophils Absolute: 0 10*3/uL (ref 0.0–0.5)
Eosinophils Relative: 0 %
HCT: 40.1 % (ref 39.0–52.0)
Hemoglobin: 13.8 g/dL (ref 13.0–17.0)
Immature Granulocytes: 0 %
Lymphocytes Relative: 26 %
Lymphs Abs: 1.3 10*3/uL (ref 0.7–4.0)
MCH: 31.4 pg (ref 26.0–34.0)
MCHC: 34.4 g/dL (ref 30.0–36.0)
MCV: 91.1 fL (ref 80.0–100.0)
Monocytes Absolute: 0.4 10*3/uL (ref 0.1–1.0)
Monocytes Relative: 8 %
Neutro Abs: 3.4 10*3/uL (ref 1.7–7.7)
Neutrophils Relative %: 66 %
Platelets: 198 10*3/uL (ref 150–400)
RBC: 4.4 MIL/uL (ref 4.22–5.81)
RDW: 11.6 % (ref 11.5–15.5)
WBC: 5.1 10*3/uL (ref 4.0–10.5)
nRBC: 0 % (ref 0.0–0.2)

## 2022-02-10 LAB — URINE DRUG SCREEN, QUALITATIVE (ARMC ONLY)
Amphetamines, Ur Screen: NOT DETECTED
Barbiturates, Ur Screen: NOT DETECTED
Benzodiazepine, Ur Scrn: NOT DETECTED
Cannabinoid 50 Ng, Ur ~~LOC~~: NOT DETECTED
Cocaine Metabolite,Ur ~~LOC~~: NOT DETECTED
MDMA (Ecstasy)Ur Screen: NOT DETECTED
Methadone Scn, Ur: NOT DETECTED
Opiate, Ur Screen: NOT DETECTED
Phencyclidine (PCP) Ur S: NOT DETECTED
Tricyclic, Ur Screen: NOT DETECTED

## 2022-02-10 LAB — AMMONIA: Ammonia: 32 umol/L (ref 9–35)

## 2022-02-10 LAB — ACETAMINOPHEN LEVEL: Acetaminophen (Tylenol), Serum: <10 ug/mL — ABNORMAL LOW (ref 10–30)

## 2022-02-10 LAB — ETHANOL: Alcohol, Ethyl (B): 401 mg/dL (ref <10)

## 2022-02-10 LAB — SALICYLATE LEVEL: Salicylate Lvl: <7 mg/dL — ABNORMAL LOW (ref 7.0–30.0)

## 2022-02-10 NOTE — ED Notes (Signed)
Pt awaking in bed asking to use bathroom. Pt helped with urinal ?

## 2022-02-10 NOTE — ED Triage Notes (Signed)
Pt to ED via [facility] from home. 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 Pt with hx substance and alcohol abuse. EMS states 12 beer cans noted in car, 5th handle of liquor halfway gone and pt currently prescribed klonopin. Pt responsive to painful stimuli with EMS. Pupils dilated and sluggish.  ? ?EMS VS ?HR 120 ?Temp 98.7 ?CBG 137 ?BP 128/76 ?Ra 98% ?RR 22 ? ?

## 2022-02-10 NOTE — ED Notes (Signed)
Robert Briggs (pts grandmother) : notified that pt is being discharged. Hilda on way to come pick up pt ?

## 2022-02-10 NOTE — ED Notes (Signed)
Earley Abide (family) [phone] ?Call with updates and when pt is ready to be picked up  ?

## 2022-02-10 NOTE — ED Notes (Signed)
Pt discharge information reviewed. Pt understands need for follow up care and when to return if symptoms worsen. All questions answered. Pt is alert and oriented with even and regular respirations. Pt is seen ambulating out of department with string steady gait.  Pts grandmother walks out with pt to bring pt home.  ?

## 2022-02-10 NOTE — ED Notes (Signed)
Ethanol level 401. Provider made aware. ? ?

## 2022-02-10 NOTE — ED Notes (Signed)
Pt family leaving bedside. Pt bed alarm turned on. Pt remains connected to all monitors ?

## 2022-02-10 NOTE — ED Notes (Signed)
Ambulatory trial performed. Pt ambulated with strong steady gait ?

## 2022-02-10 NOTE — ED Provider Notes (Signed)
? ?[facility] ?Provider Note ? ? ? Event Date/Time  ? First MD Initiated Contact with Patient 02/10/22 1711   ?  (approximate) ? ? ?History  ? ?Alcohol Intoxication ? ? ?HPI ? ?Robert Briggs is a 26 y.o. male with past medical history of alcohol use disorder presents with altered mental status.  Apparently patient is staying with his grandmother who found him altered.  They are unsure if he took any substances.  EMS says he was surrounded by beer and liquor.  Patient minimally responsive unable to obtain history. ?  ? ?Past Medical History:  ?Diagnosis Date  ? Alcohol abuse   ? ? ?Patient Active Problem List  ? Diagnosis Date Noted  ? Generalized anxiety disorder 04/15/2021  ? Panic attacks 04/15/2021  ? Alcohol abuse 04/15/2021  ? ? ? ?Physical Exam  ?Triage Vital Signs: ?ED Triage Vitals  ?Enc Vitals Group  ?   BP 02/10/22 1720 120/77  ?   Pulse Rate 02/10/22 1720 (!) 109  ?   Resp 02/10/22 1720 (!) 27  ?   Temp 02/10/22 1723 97.9 ?F (36.6 ?C)  ?   Temp Source 02/10/22 1723 Oral  ?   SpO2 02/10/22 1720 97 %  ?   Weight --   ?   Height --   ?   Head Circumference --   ?   Peak Flow --   ?   Pain Score 02/10/22 1720 Asleep  ?   Pain Loc --   ?   Pain Edu? --   ?   Excl. in [place]? --   ? ? ?Most recent vital signs: ?Vitals:  ? 02/10/22 2230 02/10/22 2231  ?BP: 100/60   ?Pulse: 80   ?Resp: 14 14  ?Temp:    ?SpO2: 95%   ? ? ? ?General: Patient is somnolent, does awaken to sternal rub ?CV:  Good peripheral perfusion.  No edema ?Resp:  Normal effort.  Protecting his airway ?Abd:  No distention.  Soft and nontender ?Neuro:             Pupils are 4 mm and reactive bilaterally, patient is somnolent, respirations are adequate and seems to be protecting his airway, has minimal response with groan to aggressive sternal rub ?Other:   ? ? ?ED Results / Procedures / Treatments  ?Labs ?(all labs ordered are listed, but only abnormal results are displayed) ?Labs Reviewed  ?COMPREHENSIVE METABOLIC PANEL -  Abnormal; Notable for the following components:  ?    Result Value  ? Potassium 3.3 (*)   ? Glucose, Bld 125 (*)   ? Calcium 8.5 (*)   ? All other components within normal limits  ?ETHANOL - Abnormal; Notable for the following components:  ? Alcohol, Ethyl (B) 401 (*)   ? All other components within normal limits  ?SALICYLATE LEVEL - Abnormal; Notable for the following components:  ? Salicylate Lvl [id number] (*)   ? All other components within normal limits  ?ACETAMINOPHEN LEVEL - Abnormal; Notable for the following components:  ? Acetaminophen (Tylenol), Serum <10 (*)   ? All other components within normal limits  ?CBC WITH DIFFERENTIAL/PLATELET  ?AMMONIA  ?URINE DRUG SCREEN, QUALITATIVE ([facility] ONLY)  ? ? ? ?EKG ? ?EKG interpreted by myself, sinus tachycardia normal axis normal intervals no acute ischemic change ? ? ?RADIOLOGY ?I reviewed the CT scan of the brain which does not show any acute intracranial process; agree with radiology report  ? ? ? ?PROCEDURES: ? ?  Critical Care performed: No ? ?.1-3 Lead EKG Interpretation ?Performed by: Rada Hay, MD ?Authorized by: Rada Hay, MD  ? ?  Interpretation: abnormal   ?  ECG rate assessment: tachycardic   ?  Rhythm: sinus tachycardia   ?  Ectopy: none   ?  Conduction: normal   ? ?The patient is on the cardiac monitor to evaluate for evidence of arrhythmia and/or significant heart rate changes. ? ? ?MEDICATIONS ORDERED IN ED: ?Medications - No data to display ? ? ?IMPRESSION / MDM / ASSESSMENT AND PLAN / ED COURSE  ?I reviewed the triage vital signs and the nursing notes. ?             ?               ?Differential diagnosis includes, but is not limited to, alcohol overdose, benzo overdose, intracranial hemorrhage, metabolic encephalopathy, less likely meningitis viral encephalitis ? ?Patient is a 26 year old male with history of alcohol use disorder presents with altered mental status.  History somewhat unclear though patient was found surrounded by liquor.   Also takes Klonopin.  Unable to obtain any history from the patient due to his depressed mental status.  He is breathing about 27 with 97% on room air mildly tachycardic but otherwise his vitals are within normal limits.  He is quite somnolent and has some groan to sternal rub but appears to be protecting his airway.  Pupils are 4 mm.  Overall exam is not consistent with opiate overdose given the tachypnea.  She is may be CNS depressant such as alcohol or benzo overdose.  Will obtain a CT of his head as well and labs including ethanol level Tylenol and salicylate level.  We will continue to monitor.  Not requiring intubation at this time. ? ?Since ethanol level is over 400, labs otherwise reassuring.  This makes sense with his presentation.  He was observed in the ED for several hours and improved clinically.  On reassessment he is awake and alert and able to ambulate.  Will be discharged with [facility] follow-up. ? ?  ? ? ?FINAL CLINICAL IMPRESSION(S) / ED DIAGNOSES  ? ?Final diagnoses:  ?Alcoholic intoxication without complication ([place])  ? ? ? ?Rx / DC Orders  ? ?ED Discharge Orders   ? ? None  ? ?  ? ? ? ?Note:  This document was prepared using Dragon voice recognition software and may include unintentional dictation errors. ?  ?Rada Hay, MD ?02/11/22 [id number] ? ?

## 2022-02-10 NOTE — ED Notes (Signed)
Pt resting in bed. Appears to be sleeping at this time. Chest is rising and falling symmetrically. No acute distress noted. Bed alarm remains on ?

## 2022-03-16 ENCOUNTER — Other Ambulatory Visit: Payer: Self-pay

## 2022-03-16 ENCOUNTER — Encounter: Payer: Self-pay | Admitting: Emergency Medicine

## 2022-03-16 ENCOUNTER — Emergency Department: Payer: Self-pay

## 2022-03-16 ENCOUNTER — Emergency Department
Admission: EM | Admit: 2022-03-16 | Discharge: 2022-03-16 | Disposition: A | Discharge status: Home / Self Care | Payer: Self-pay | Attending: Emergency Medicine | Admitting: Emergency Medicine

## 2022-03-16 DIAGNOSIS — X58XXXA Exposure to other specified factors, initial encounter: Secondary | ICD-10-CM | POA: Insufficient documentation

## 2022-03-16 DIAGNOSIS — F1010 Alcohol abuse, uncomplicated: Secondary | ICD-10-CM | POA: Insufficient documentation

## 2022-03-16 DIAGNOSIS — R079 Chest pain, unspecified: Secondary | ICD-10-CM | POA: Insufficient documentation

## 2022-03-16 DIAGNOSIS — R110 Nausea: Secondary | ICD-10-CM | POA: Insufficient documentation

## 2022-03-16 DIAGNOSIS — Z5321 Procedure and treatment not carried out due to patient leaving prior to being seen by health care provider: Secondary | ICD-10-CM | POA: Insufficient documentation

## 2022-03-16 DIAGNOSIS — T50901A Poisoning by unspecified drugs, medicaments and biological substances, accidental (unintentional), initial encounter: Secondary | ICD-10-CM | POA: Insufficient documentation

## 2022-03-16 LAB — BASIC METABOLIC PANEL
Anion gap: 13 (ref 5–15)
BUN: 5 mg/dL — ABNORMAL LOW (ref 6–20)
CO2: 28 mmol/L (ref 22–32)
Calcium: 9.5 mg/dL (ref 8.9–10.3)
Chloride: 100 mmol/L (ref 98–111)
Creatinine, Ser: 0.72 mg/dL (ref 0.61–1.24)
GFR, Estimated: >60 mL/min (ref >60)
Glucose, Bld: 140 mg/dL — ABNORMAL HIGH (ref 70–99)
Potassium: 3.8 mmol/L (ref 3.5–5.1)
Sodium: 141 mmol/L (ref 135–145)

## 2022-03-16 LAB — CBC
HCT: 47.4 % (ref 39.0–52.0)
Hemoglobin: 16.7 g/dL (ref 13.0–17.0)
MCH: 31 pg (ref 26.0–34.0)
MCHC: 35.2 g/dL (ref 30.0–36.0)
MCV: 87.9 fL (ref 80.0–100.0)
Platelets: 253 10*3/uL (ref 150–400)
RBC: 5.39 MIL/uL (ref 4.22–5.81)
RDW: 12 % (ref 11.5–15.5)
WBC: 8.5 10*3/uL (ref 4.0–10.5)
nRBC: 0 % (ref 0.0–0.2)

## 2022-03-16 LAB — TROPONIN I (HIGH SENSITIVITY)
Troponin I (High Sensitivity): 4 ng/L (ref <18)
Troponin I (High Sensitivity): 5 ng/L (ref <18)

## 2022-03-16 LAB — ETHANOL
Alcohol, Ethyl (B): 327 mg/dL (ref <10)
Alcohol, Ethyl (B): 147 mg/dL — ABNORMAL HIGH (ref <10)

## 2022-03-16 NOTE — ED Triage Notes (Signed)
Pt presents via POV c/o chest pain and "alcohol poisoning". Pt reports was here earlier but left prior to being seen. Reports ETOH abuse.  ?

## 2022-03-16 NOTE — ED Notes (Signed)
Called several times from lobby with no answer 

## 2022-03-16 NOTE — ED Triage Notes (Signed)
Pt via POV from home. Pt states he drank 6 oz of hand sanitizer. States that it was not to kill himself, but it was to get drunk. Pt has a hx of alcoholism. States he drunk half gallons of vodka before he drank the hand sanitizer. Pt c/o chest pain and nausea. Pt is A&OX4 and NAD.  ?

## 2022-03-17 ENCOUNTER — Emergency Department
Admission: EM | Admit: 2022-03-17 | Discharge: 2022-03-17 | Disposition: A | Discharge status: Home / Self Care | Payer: Self-pay | Attending: Emergency Medicine | Admitting: Emergency Medicine

## 2022-03-17 ENCOUNTER — Emergency Department: Payer: Self-pay

## 2022-03-17 ENCOUNTER — Other Ambulatory Visit: Payer: Self-pay

## 2022-03-17 DIAGNOSIS — F1099 Alcohol use, unspecified with unspecified alcohol-induced disorder: Secondary | ICD-10-CM | POA: Insufficient documentation

## 2022-03-17 DIAGNOSIS — T512X1A Toxic effect of 2-Propanol, accidental (unintentional), initial encounter: Secondary | ICD-10-CM | POA: Insufficient documentation

## 2022-03-17 DIAGNOSIS — F419 Anxiety disorder, unspecified: Secondary | ICD-10-CM | POA: Insufficient documentation

## 2022-03-17 DIAGNOSIS — R000 Tachycardia, unspecified: Secondary | ICD-10-CM | POA: Insufficient documentation

## 2022-03-17 DIAGNOSIS — Y908 Blood alcohol level of 240 mg/100 ml or more: Secondary | ICD-10-CM | POA: Insufficient documentation

## 2022-03-17 LAB — URINE DRUG SCREEN, QUALITATIVE (ARMC ONLY)
Amphetamines, Ur Screen: NOT DETECTED
Barbiturates, Ur Screen: NOT DETECTED
Benzodiazepine, Ur Scrn: NOT DETECTED
Cannabinoid 50 Ng, Ur ~~LOC~~: NOT DETECTED
Cocaine Metabolite,Ur ~~LOC~~: NOT DETECTED
MDMA (Ecstasy)Ur Screen: NOT DETECTED
Methadone Scn, Ur: NOT DETECTED
Opiate, Ur Screen: NOT DETECTED
Phencyclidine (PCP) Ur S: NOT DETECTED
Tricyclic, Ur Screen: NOT DETECTED

## 2022-03-17 LAB — CBC
HCT: 47.5 % (ref 39.0–52.0)
Hemoglobin: 16.3 g/dL (ref 13.0–17.0)
MCH: 30.6 pg (ref 26.0–34.0)
MCHC: 34.3 g/dL (ref 30.0–36.0)
MCV: 89.3 fL (ref 80.0–100.0)
Platelets: 237 10*3/uL (ref 150–400)
RBC: 5.32 MIL/uL (ref 4.22–5.81)
RDW: 12 % (ref 11.5–15.5)
WBC: 5.1 10*3/uL (ref 4.0–10.5)
nRBC: 0 % (ref 0.0–0.2)

## 2022-03-17 LAB — COMPREHENSIVE METABOLIC PANEL
ALT: 59 U/L — ABNORMAL HIGH (ref 0–44)
AST: 63 U/L — ABNORMAL HIGH (ref 15–41)
Albumin: 5.1 g/dL — ABNORMAL HIGH (ref 3.5–5.0)
Alkaline Phosphatase: 103 U/L (ref 38–126)
Anion gap: 10 (ref 5–15)
BUN: <5 mg/dL — ABNORMAL LOW (ref 6–20)
CO2: 33 mmol/L — ABNORMAL HIGH (ref 22–32)
Calcium: 9.7 mg/dL (ref 8.9–10.3)
Chloride: 99 mmol/L (ref 98–111)
Creatinine, Ser: 0.8 mg/dL (ref 0.61–1.24)
GFR, Estimated: >60 mL/min (ref >60)
Glucose, Bld: 146 mg/dL — ABNORMAL HIGH (ref 70–99)
Potassium: 3.7 mmol/L (ref 3.5–5.1)
Sodium: 142 mmol/L (ref 135–145)
Total Bilirubin: 1.1 mg/dL (ref 0.3–1.2)
Total Protein: 8.5 g/dL — ABNORMAL HIGH (ref 6.5–8.1)

## 2022-03-17 LAB — ETHANOL: Alcohol, Ethyl (B): 308 mg/dL (ref <10)

## 2022-03-17 MED ORDER — LORAZEPAM 2 MG PO TABS
2.0000 mg | ORAL_TABLET | Freq: Once | ORAL | Status: AC
  Administered 2022-03-17: 2 mg via ORAL
  Filled 2022-03-17: qty 1

## 2022-03-17 MED ORDER — CHLORDIAZEPOXIDE HCL 25 MG PO CAPS: ORAL_CAPSULE | ORAL | 0 refills | Status: AC

## 2022-03-17 NOTE — ED Notes (Signed)
Called Poison control and updated on pt's status. Recommendations EKG and place on monitor, alcohol level and electrolytes give thiamine and folic acid with hydration. ?

## 2022-03-17 NOTE — ED Provider Notes (Signed)
? ?[facility] ?Provider Note ? ? ? Event Date/Time  ? First MD Initiated Contact with Patient 03/17/22 [id number]   ?  (approximate) ? ? ?History  ? ?Toxic agent ? ? ?HPI ? ?Robert Briggs is a 26 y.o. male with a history of alcohol abuse, anxiety disorder who presents after ingesting apparently 20 ounces of hand sanitizer.  He reports he did this around 2 AM.  He denies physical complaints besides mild nausea.  He is anxious. ?  ? ? ?Physical Exam  ? ?Triage Vital Signs: ?ED Triage Vitals  ?Enc Vitals Group  ?   BP 03/17/22 0747 (!) 132/104  ?   Pulse Rate 03/17/22 0747 (!) 120  ?   Resp 03/17/22 0747 16  ?   Temp 03/17/22 0747 97.6 ?F (36.4 ?C)  ?   Temp src --   ?   SpO2 03/17/22 0747 99 %  ?   Weight --   ?   Height --   ?   Head Circumference --   ?   Peak Flow --   ?   Pain Score 03/17/22 0745 5  ?   Pain Loc --   ?   Pain Edu? --   ?   Excl. in GC? --   ? ? ?Most recent vital signs: ?Vitals:  ? 03/17/22 0952 03/17/22 1301  ?BP: (!) 128/93 125/83  ?Pulse: 99 (!) 107  ?Resp: 17 16  ?Temp:  98 ?F (36.7 ?C)  ?SpO2: 100% 99%  ? ? ? ?General: Awake, no distress.  ?CV:  Good peripheral perfusion.  Tachycardia ?Resp:  Normal effort.  ?Abd:  No distention.  No tenderness palpation ?Other:   ? ? ?ED Results / Procedures / Treatments  ? ?Labs ?(all labs ordered are listed, but only abnormal results are displayed) ?Labs Reviewed  ?COMPREHENSIVE METABOLIC PANEL - Abnormal; Notable for the following components:  ?    Result Value  ? CO2 33 (*)   ? Glucose, Bld 146 (*)   ? BUN <5 (*)   ? Total Protein 8.5 (*)   ? Albumin 5.1 (*)   ? AST 63 (*)   ? ALT 59 (*)   ? All other components within normal limits  ?ETHANOL - Abnormal; Notable for the following components:  ? Alcohol, Ethyl (B) 308 (*)   ? All other components within normal limits  ?CBC  ?URINE DRUG SCREEN, QUALITATIVE ([facility] ONLY)  ?VOLATILES,BLD-ACETONE,ETHANOL,ISOPROP,METHANOL  ? ? ? ?EKG ? ?ED ECG REPORT ?I, Jene Every, the attending  physician, personally viewed and interpreted this ECG. ? ?Date: 03/17/2022 ? ?Rhythm: Sinus tachycardia ?QRS Axis: normal ?Intervals: normal ?ST/T Wave abnormalities: normal ?Narrative Interpretation: no evidence of acute ischemia ? ? ? ?RADIOLOGY ?Chest x-ray interpreted by me, no acute abnormality ? ? ? ?PROCEDURES: ? ?Critical Care performed:  ? ?Procedures ? ? ?MEDICATIONS ORDERED IN ED: ?Medications  ?LORazepam (ATIVAN) tablet 2 mg (2 mg Oral Given 03/17/22 0920)  ? ? ? ?IMPRESSION / MDM / ASSESSMENT AND PLAN / ED COURSE  ?I reviewed the triage vital signs and the nursing notes. ? ? ?Patient presents after ingestion of isopropyl alcohol.  He denies other substance abuse. ? ?This is an acute illness or injury that poses a threat to life/bodily function ? ?Differential includes alcohol abuse, toxic alcohols, other substance abuse, alcohol withdrawal ? ?Patient is tachycardic but otherwise in no acute distress, he is not tremulous.  He is quite anxious and is concerned that  he may have poisoned himself. ? ?Did give 2 mg p.o. Ativan for anxiety which did help to calm him down. ? ?Lab work demonstrates reassuring CMP CBC, UDS is negative ? ?His alcohol level was 308 this morning. ? ?Currently he appears clinically sober he reports he has a bed reserved for him at our [facility] tomorrow ? ?He has no SI or HI, he is clinically sober and appropriate for discharge at this time, will prescribe Librium for him ? ? ? ? ?  ? ? ?FINAL CLINICAL IMPRESSION(S) / ED DIAGNOSES  ? ?Final diagnoses:  ?Isopropyl alcohol poisoning  ? ? ? ?Rx / DC Orders  ? ?ED Discharge Orders   ? ?      Ordered  ?  chlordiazePOXIDE (LIBRIUM) 25 MG capsule       ? 03/17/22 1243  ? ?  ?  ? ?  ? ? ? ?Note:  This document was prepared using Dragon voice recognition software and may include unintentional dictation errors. ?  ?Jene Every, MD ?03/17/22 1537 ? ?

## 2022-03-17 NOTE — ED Notes (Addendum)
Pt standing at bedside telling staff he is having a seizure.  Pt assured this was not the case.  Pt continues to ask for water.  RN will wait for EDP to see patient.  ?

## 2022-03-17 NOTE — ED Notes (Signed)
Pt will not be placed on monitor at this time per EDP. ?

## 2022-03-17 NOTE — ED Notes (Signed)
Pt given water per EDP. 

## 2022-03-17 NOTE — ED Notes (Signed)
Pt discharged home with grandmother.  VS stable.  Pt denies SI.  Pt aware he will have a bed tomorrow at [facility].   ?

## 2022-03-17 NOTE — ED Triage Notes (Addendum)
Pt comes with c/o consuming toxic agent of hand santizier. Pt states he has drank almost a whole 16.9 fl oz of sanitizer.  ? ?Pt drinks alcohol everyday. Pt states beer and liquor. Pt states it is large consumption. Pt states nausea and vomiting.  ? ?Pt states last alcohol was Saturday and then he started drinking the sanitizer on Sunday and has been drinking it since. Pt denies any SI or HI. ?

## 2022-03-17 NOTE — ED Notes (Signed)
Patient resting quietly in room. No noted distress or abnormal behaviors noted. Will continue 15 minute checks. 

## 2022-03-17 NOTE — BH Assessment (Signed)
Comprehensive Clinical Assessment (CCA) Screening, Triage and Referral Note ? ?03/17/2022 ?Robert Briggs ?[id number] ? ?Robert Briggs, 26 year old male who presents to [facility] ED involuntarily for treatment. Per triage note, Pt comes with c/o consuming toxic agent of hand sanitizer. Pt states he has drank almost a whole 16.9 fl oz of sanitizer. Pt drinks alcohol every day. Pt states beer and liquor. Pt states it is large consumption. Pt states nausea and vomiting. Pt states last alcohol was Saturday and then he started drinking the sanitizer on Sunday and has been drinking it since. Pt denies any SI or HI.  ? ?During TTS assessment pt presents alert and oriented x 4, restless but cooperative, and mood-congruent with affect. The pt does not appear to be responding to internal or external stimuli. Neither is the pt presenting with any delusional thinking. Pt verified the information provided to triage RN.  ? ?Pt identifies his main complaint to be that he wants help with his drinking. Patient reports he drank 4-5 bottles of Benadryl and a bottle of hand sanitizer but states he was not trying to kill himself. ? I want to live but I can?t stop drinking. It makes me feel better.? Patient reports he drinks because of his anxiety. Patient states he has been drinking since the age of 30. 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 Patient states he was prescribed Xanax and Klonopin months ago by Dr. Mayford Knife; however, they did not help so he stopped taking them. Patient has been to detox before and wants to go back to [facility]. Unfortunately, they do not have beds available today but are expecting discharges on tomm. Patient plans to walk-in for treatment. Pt denies current SI/HI/AH/VH. Pt contracts for safety.   ? ?Chief Complaint:  ?Chief Complaint  ?Patient presents with  ? Toxic agent  ? ?Visit Diagnosis: Alcohol use disorder ? ?Patient Reported Information ?How did you hear about [place]?  Self ? ?What Is the Reason for Your Visit/Call Today? Patient presents voluntarily requesting detox from alcohol. ? ?How Long Has This Been Causing You Problems? > than 6 months ? ?What Do You Feel Would Help You the Most Today? Alcohol or Drug Use Treatment; Medication(s) ? ? ?Have You Recently Had Any Thoughts About Hurting Yourself? No ? ?Are You Planning to Commit Suicide/Harm Yourself At This time? No ? ? ?Have you Recently Had Thoughts About Hurting Someone Robert Briggs? No ? ?Are You Planning to Harm Someone at This Time? No ? ?Explanation: No data recorded ? ?Have You Used Any Alcohol or Drugs in the Past 24 Hours? Yes ? ?How Long Ago Did You Use Drugs or Alcohol? No data recorded ?What Did You Use and How Much? Alcohol, hand sanitizer and Benadryl ? ? ?Do You Currently Have a Therapist/Psychiatrist? No ? ?Name of Therapist/Psychiatrist: No data recorded ? ?Have You Been Recently Discharged From Any Office Practice or Programs? No ? ?Explanation of Discharge From Practice/Program: No data recorded ?  ?CCA Screening Triage Referral Assessment ?Type of Contact: Face-to-Face ? ?Telemedicine Service Delivery:   ?Is this Initial or Reassessment? Initial Assessment ? ?Date Telepsych consult ordered in [facility]:  05/18/21 ? ?Time Telepsych consult ordered in [facility]:  No data recorded ?Location of Assessment: [facility] ED ? ?Provider Location: [facility] ED ? ? ?Collateral Involvement: None provided ? ? ?Does Patient Have a Automotive engineer Guardian? No data recorded ?Name and Contact of Legal Guardian: No data recorded ?If Minor and Not Living with  Parent(s), Who has Custody? n/a ? ?Is CPS involved or ever been involved? Never ? ?Is APS involved or ever been involved? Never ? ? ?Patient Determined To Be At Risk for Harm To Self or Others Based on Review of Patient Reported Information or Presenting Complaint? No ? ?Method: No Plan ? ?Availability of Means: No access or NA ? ?Intent: No data recorded ?Notification Required: No need or  identified person ? ?Additional Information for Danger to Others Potential: No data recorded ?Additional Comments for Danger to Others Potential: No data recorded ?Are There Guns or Other Weapons in Your Home? No ? ?Types of Guns/Weapons: No data recorded ?Are These Weapons Safely Secured?                            No data recorded ?Who Could Verify You Are Able To Have These Secured: No data recorded ?Do You Have any Outstanding Charges, Pending Court Dates, Parole/Probation? No data recorded ?Contacted To Inform of Risk of Harm To Self or Others: No data recorded ? ?Does Patient Present under Involuntary Commitment? No ? ?IVC Papers Initial File Date: No data recorded ? ?[place] of Residence: [place] ? ? ?Patient Currently Receiving the Following Services: Not Receiving Services ? ? ?Determination of Need: Emergent (2 hours) ? ? ?Options For Referral: Chemical Dependency Intensive Outpatient Therapy (CDIOP); ED Visit; Therapeutic Triage Services; Outpatient Therapy ? ? ?Discharge Disposition:  ?  ? ?Ayrianna Mcginniss Dierdre Searles, Counselor, LCAS-A ? ? ?  ?  ?  ? ? ?

## 2022-03-17 NOTE — ED Notes (Signed)
Pt reports a burning in his chest.   ?

## 2022-03-17 NOTE — ED Notes (Signed)
EDP at bedside  

## 2022-03-17 NOTE — ED Notes (Signed)
Pt to xray

## 2022-03-17 NOTE — ED Notes (Signed)
Pt restless and needing verbal re-direction to stay in bed.  Pt states, "I feel like something is really wrong."  RN assured he would be seen by a doctor.  ?

## 2022-03-18 LAB — VOLATILES,BLD-ACETONE,ETHANOL,ISOPROP,METHANOL
Acetone, blood: <0.01 g/dL (ref 0.000–0.010)
Ethanol, blood: 0.201 g/dL — ABNORMAL HIGH (ref 0.000–0.010)
Isopropanol, blood: <0.01 g/dL (ref 0.000–0.010)
Methanol, blood: <0.01 g/dL (ref 0.000–0.010)

## 2022-05-18 ENCOUNTER — Other Ambulatory Visit: Payer: Self-pay

## 2022-05-18 MED ORDER — HYDROXYZINE HCL 25 MG PO TABS
ORAL_TABLET | ORAL | 1 refills | Status: DC
  Filled 2022-05-18: qty 60, 30d supply, fill #0

## 2022-05-18 MED ORDER — BUPROPION HCL ER (XL) 150 MG PO TB24
ORAL_TABLET | ORAL | 1 refills | Status: DC
  Filled 2022-05-18: qty 30, 30d supply, fill #0

## 2022-05-22 ENCOUNTER — Other Ambulatory Visit: Payer: Self-pay | Admitting: Pharmacy Technician

## 2022-05-22 ENCOUNTER — Other Ambulatory Visit: Payer: Self-pay

## 2022-05-22 NOTE — Patient Outreach (Signed)
Patient only signed DOH Attestation.  Would need to provide current year's household income if PAP medications were needed.  Berthold Glace J. Davien Malone Patient Advocate Specialist [facility]  

## 2022-06-16 ENCOUNTER — Other Ambulatory Visit: Payer: Self-pay

## 2022-06-16 MED ORDER — BUPROPION HCL ER (XL) 300 MG PO TB24
ORAL_TABLET | ORAL | 1 refills | Status: DC
  Filled 2022-06-16 (×2): qty 30, 30d supply, fill #0
  Filled 2022-07-21: qty 30, 30d supply, fill #1

## 2022-07-07 ENCOUNTER — Ambulatory Visit: Payer: Self-pay | Admitting: Gerontology

## 2022-07-16 ENCOUNTER — Other Ambulatory Visit: Payer: Self-pay

## 2022-07-21 ENCOUNTER — Other Ambulatory Visit: Payer: Self-pay

## 2022-07-29 ENCOUNTER — Ambulatory Visit: Payer: Self-pay | Admitting: Gerontology

## 2022-08-19 ENCOUNTER — Other Ambulatory Visit: Payer: Self-pay

## 2022-08-21 IMAGING — CR DG CHEST 2V
1 series · 2 of 2 positions shown · non-contrast
Comparison: None Available.

CLINICAL DATA: Chest pain

EXAM:
CHEST - 2 VIEW

[Series 1: dg chest 2 view · 0.14mm/px · 2 of 2 slices shown]
[im 1/2]
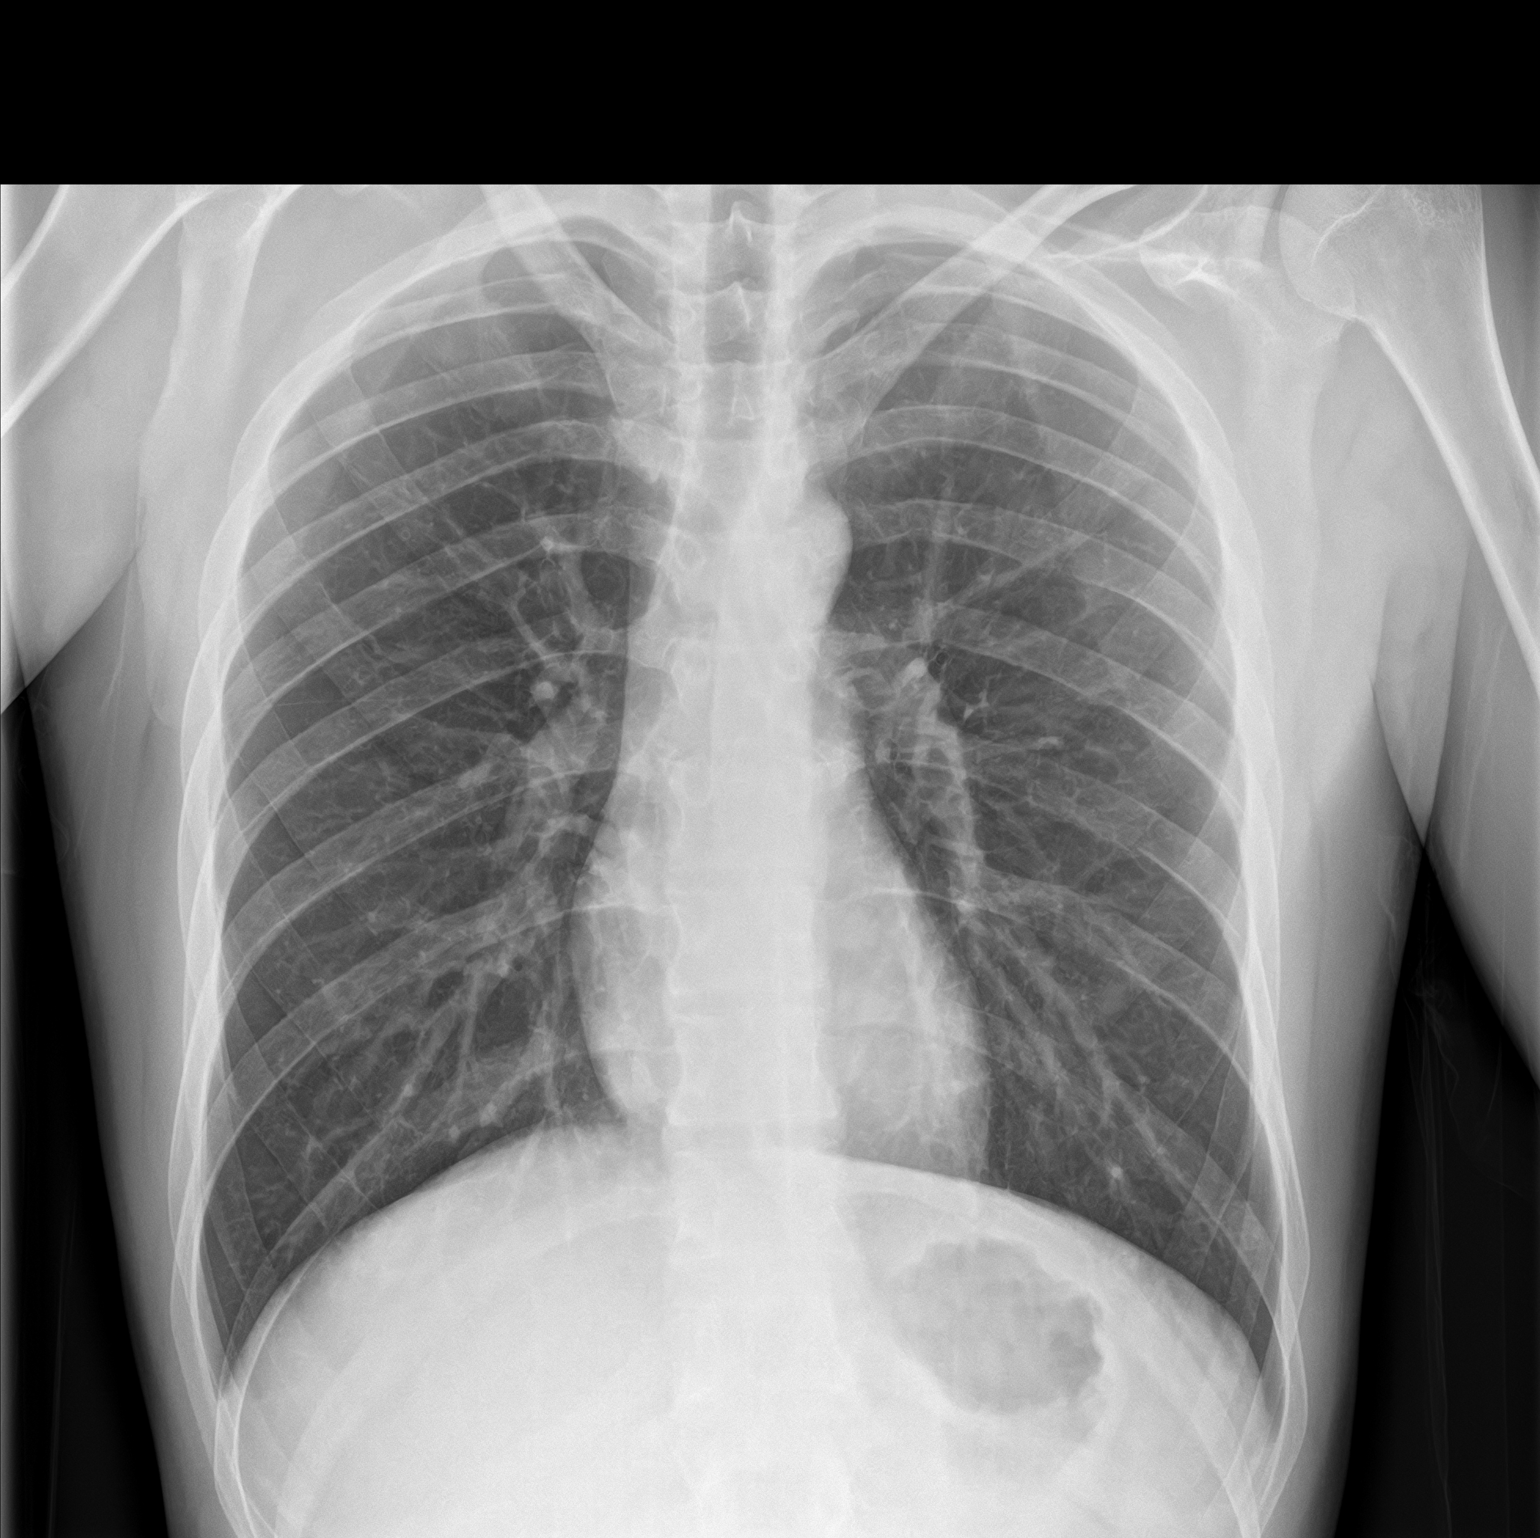
[im 2/2]
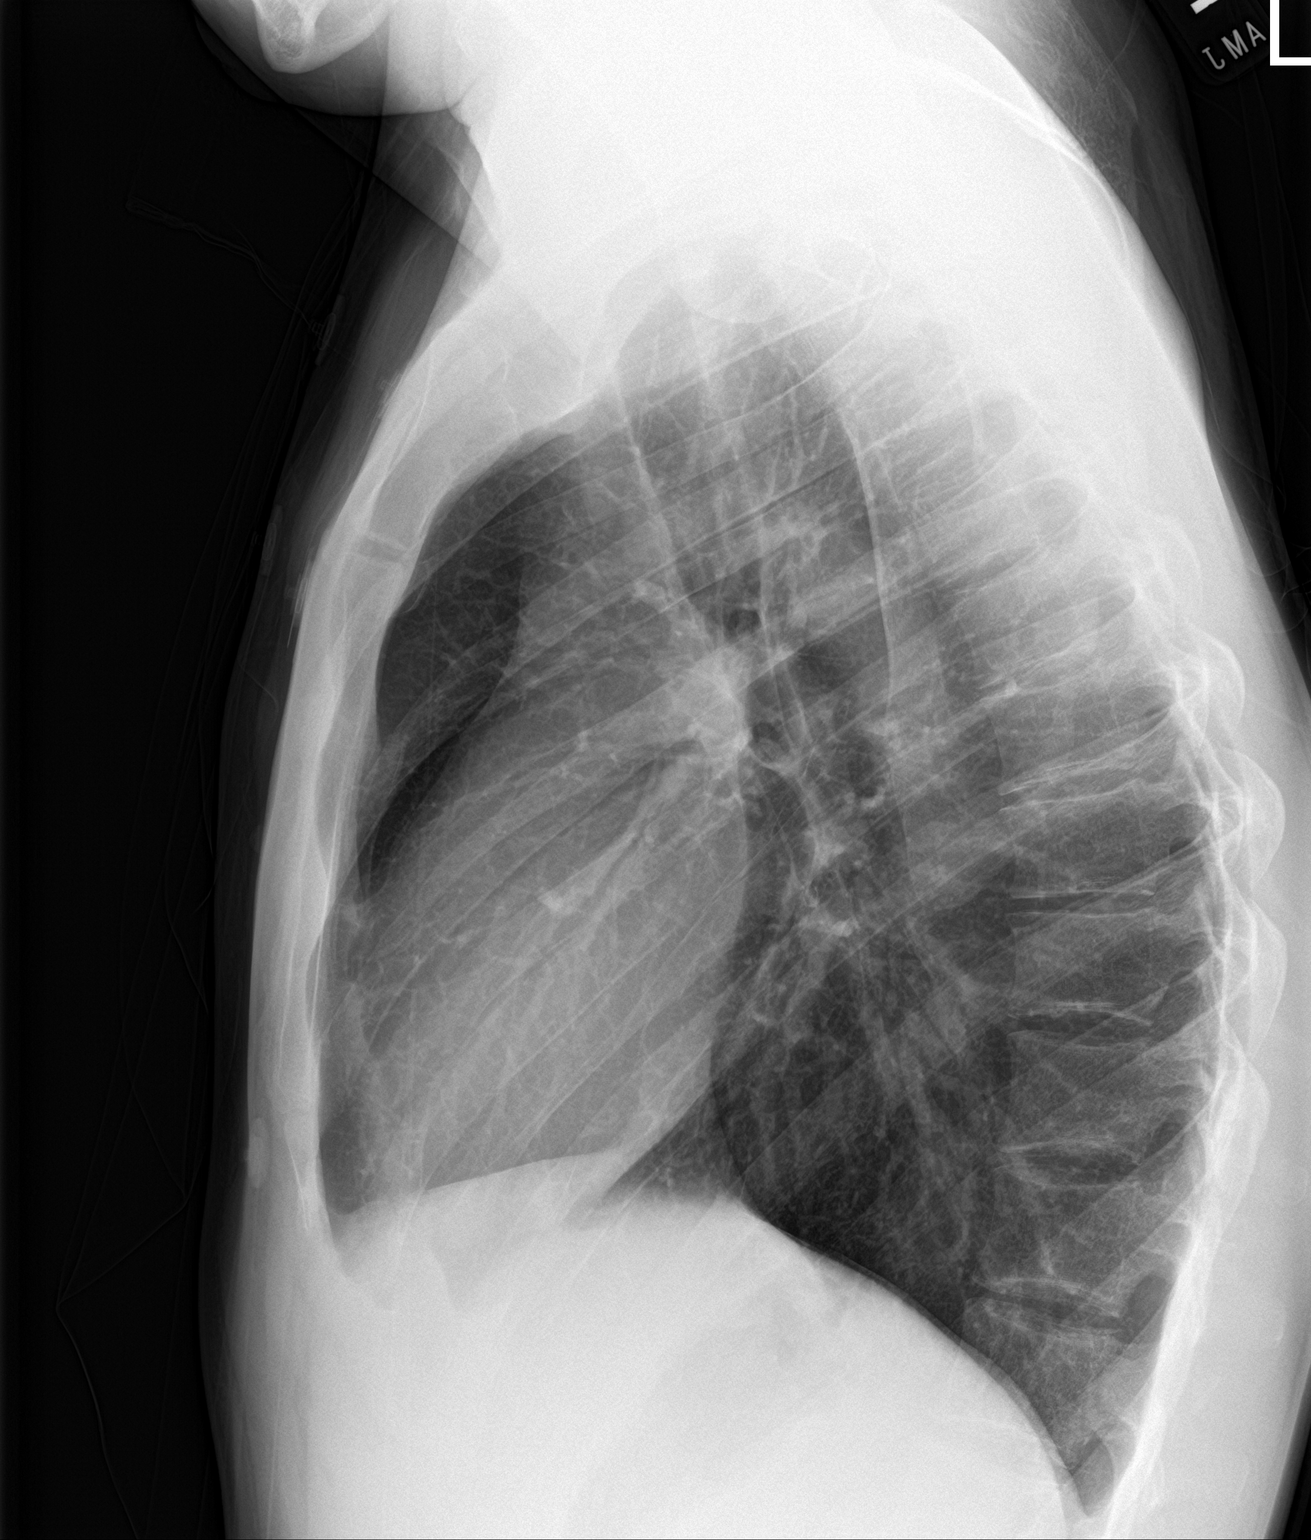

[2 of 2 positions shown; findings below may reference images not displayed]

FINDINGS: The heart size and mediastinal contours are within normal limits.
Both lungs are clear. The visualized skeletal structures are
unremarkable.
IMPRESSION: No active cardiopulmonary disease.

## 2022-08-25 ENCOUNTER — Other Ambulatory Visit: Payer: Self-pay

## 2022-08-25 MED ORDER — BUPROPION HCL ER (XL) 300 MG PO TB24
ORAL_TABLET | ORAL | 1 refills | Status: DC
  Filled 2022-08-25: qty 30, 30d supply, fill #0

## 2023-03-29 ENCOUNTER — Other Ambulatory Visit: Payer: Self-pay

## 2023-06-15 ENCOUNTER — Other Ambulatory Visit: Payer: Self-pay

## 2023-08-03 ENCOUNTER — Other Ambulatory Visit: Payer: Self-pay

## 2023-08-03 DIAGNOSIS — F10120 Alcohol abuse with intoxication, uncomplicated: Secondary | ICD-10-CM | POA: Insufficient documentation

## 2023-08-03 DIAGNOSIS — Y908 Blood alcohol level of 240 mg/100 ml or more: Secondary | ICD-10-CM | POA: Insufficient documentation

## 2023-08-03 LAB — BASIC METABOLIC PANEL
Anion gap: 11 (ref 5–15)
BUN: <5 mg/dL — ABNORMAL LOW (ref 6–20)
CO2: 26 mmol/L (ref 22–32)
Calcium: 9.1 mg/dL (ref 8.9–10.3)
Chloride: 105 mmol/L (ref 98–111)
Creatinine, Ser: 0.74 mg/dL (ref 0.61–1.24)
GFR, Estimated: >60 mL/min (ref >60)
Glucose, Bld: 115 mg/dL — ABNORMAL HIGH (ref 70–99)
Potassium: 3.4 mmol/L — ABNORMAL LOW (ref 3.5–5.1)
Sodium: 142 mmol/L (ref 135–145)

## 2023-08-03 LAB — CBC
HCT: 43.1 % (ref 39.0–52.0)
Hemoglobin: 15.1 g/dL (ref 13.0–17.0)
MCH: 30.9 pg (ref 26.0–34.0)
MCHC: 35 g/dL (ref 30.0–36.0)
MCV: 88.3 fL (ref 80.0–100.0)
Platelets: 230 10*3/uL (ref 150–400)
RBC: 4.88 MIL/uL (ref 4.22–5.81)
RDW: 12 % (ref 11.5–15.5)
WBC: 5.2 10*3/uL (ref 4.0–10.5)
nRBC: 0 % (ref 0.0–0.2)

## 2023-08-03 NOTE — ED Triage Notes (Signed)
Father of patient called and stated pt said "he has no purpose in life". Pt denies SI / HI when asked again. Calm and cooperative at this time.

## 2023-08-03 NOTE — ED Triage Notes (Signed)
Pt presents via EMS from home. When asking pt why he presented to the ED he reported "family called EMS to bring to hospital." Denies SI/HI when asked. When asked regarding if requesting alcohol rehab pt responds "something like that". Denies wanting to see psych. Pt calm and cooperative in triage.

## 2023-08-03 NOTE — ED Triage Notes (Signed)
Pt asking this RN "can my parents come pick me up".

## 2023-08-04 ENCOUNTER — Emergency Department
Admission: EM | Admit: 2023-08-04 | Discharge: 2023-08-04 | Disposition: A | Discharge status: Home / Self Care | Payer: Self-pay | Attending: Emergency Medicine | Admitting: Emergency Medicine

## 2023-08-04 DIAGNOSIS — F10920 Alcohol use, unspecified with intoxication, uncomplicated: Secondary | ICD-10-CM

## 2023-08-04 LAB — ETHANOL: Alcohol, Ethyl (B): 250 mg/dL — ABNORMAL HIGH

## 2023-08-04 NOTE — ED Provider Notes (Signed)
[facility] Provider Note    Event Date/Time   First MD Initiated Contact with Patient 08/04/23 0010     (approximate)   History   Chief Complaint Alcohol Intoxication   HPI  Robert Briggs is a 27 y.o. male with past medical history of alcohol abuse and anxiety who presents to the ED complaining of alcohol intoxication.  Patient arrived via EMS from home and EMS reported that family called to have him brought to the hospital due to alcohol abuse.  Patient admits to alcohol consumption tonight, denies drug use.  He denies any complaints this time and also denies any suicidal or homicidal ideation.  He admits to regular alcohol consumption, but does not express interest in rehab.     Physical Exam   Triage Vital Signs: ED Triage Vitals  Encounter Vitals Group     BP 08/03/23 2224 (!) 129/99     Systolic BP Percentile --      Diastolic BP Percentile --      Pulse Rate 08/03/23 2224 94     Resp 08/03/23 2224 16     Temp 08/03/23 2224 98.4 F (36.9 C)     Temp Source 08/03/23 2224 Oral     SpO2 08/03/23 2224 98 %     Weight 08/04/23 0019 150 lb (68 kg)     Height 08/04/23 0019 6\' 3"  (1.905 m)     Head Circumference --      Peak Flow --      Pain Score 08/03/23 2225 0     Pain Loc --      Pain Education --      Exclude from Growth Chart --     Most recent vital signs: Vitals:   08/03/23 2224 08/04/23 0234  BP: (!) 129/99 122/84  Pulse: 94 89  Resp: 16 16  Temp: 98.4 F (36.9 C) 98 F (36.7 C)  SpO2: 98% 98%    Constitutional: Somnolent but arousable to voice, intoxicated appearing. Eyes: Conjunctivae are normal. Head: Atraumatic. Nose: No congestion/rhinnorhea. Mouth/Throat: Mucous membranes are moist.  Cardiovascular: Normal rate, regular rhythm. Grossly normal heart sounds.  2+ radial pulses bilaterally. Respiratory: Normal respiratory effort.  No retractions. Lungs CTAB. Gastrointestinal: Soft and nontender. No  distention. Musculoskeletal: No lower extremity tenderness nor edema.  Neurologic:  Normal speech and language. No gross focal neurologic deficits are appreciated.    ED Results / Procedures / Treatments   Labs (all labs ordered are listed, but only abnormal results are displayed) Labs Reviewed  BASIC METABOLIC PANEL - Abnormal; Notable for the following components:      Result Value   Potassium 3.4 (*)    Glucose, Bld 115 (*)    BUN <5 (*)    All other components within normal limits  ETHANOL - Abnormal; Notable for the following components:   Alcohol, Ethyl (B) 250 (*)    All other components within normal limits  CBC    PROCEDURES:  Critical Care performed: No  Procedures   MEDICATIONS ORDERED IN ED: Medications - No data to display   IMPRESSION / MDM / ASSESSMENT AND PLAN / ED COURSE  I reviewed the triage vital signs and the nursing notes.                              27 y.o. male with past medical history of alcohol abuse and anxiety who presents to the ED  for alcohol intoxication, denies any complaints at this time.  Patient's presentation is most consistent with acute complicated illness / injury requiring diagnostic workup.  Differential diagnosis includes, but is not limited to, alcohol intoxication, anemia, electrolyte abnormality, AKI.  Patient well-appearing and in no acute distress, he is intoxicated appearing but is easily arousable and denies any complaints.  Vital signs are unremarkable and he has a benign abdominal exam.  Labs show no significant anemia, leukocytosis, tract abnormality, or AKI.  We will check ethanol level and observe until he is clinically sober.  He denies any suicidal or homicidal ideation, no indication for psychiatric evaluation at this time and he does not seem motivated to seek rehab.  Patient clinically sober on reevaluation, denies any complaints.  He once again denies any suicidal or homicidal ideation, is requesting  resources for outpatient detox.  He is appropriate for discharge home, was provided with requested resources and counseled to return to the ED for new or worsening symptoms.  Patient agrees with plan.      FINAL CLINICAL IMPRESSION(S) / ED DIAGNOSES   Final diagnoses:  Alcoholic intoxication without complication (HCC)     Rx / DC Orders   ED Discharge Orders     None        Note:  This document was prepared using Dragon voice recognition software and may include unintentional dictation errors.   Chesley Noon, MD 08/04/23 0630

## 2023-08-04 NOTE — ED Notes (Signed)
Pt provided apple juice.

## 2023-08-17 ENCOUNTER — Other Ambulatory Visit: Payer: Self-pay

## 2023-08-17 ENCOUNTER — Emergency Department
Admission: EM | Admit: 2023-08-17 | Discharge: 2023-08-17 | Disposition: A | Discharge status: Home / Self Care | Payer: MEDICAID | Attending: Emergency Medicine | Admitting: Emergency Medicine

## 2023-08-17 DIAGNOSIS — R42 Dizziness and giddiness: Secondary | ICD-10-CM | POA: Diagnosis present

## 2023-08-17 DIAGNOSIS — R4182 Altered mental status, unspecified: Secondary | ICD-10-CM | POA: Insufficient documentation

## 2023-08-17 DIAGNOSIS — R002 Palpitations: Secondary | ICD-10-CM | POA: Insufficient documentation

## 2023-08-17 DIAGNOSIS — F1010 Alcohol abuse, uncomplicated: Secondary | ICD-10-CM

## 2023-08-17 DIAGNOSIS — R4781 Slurred speech: Secondary | ICD-10-CM | POA: Insufficient documentation

## 2023-08-17 DIAGNOSIS — F10129 Alcohol abuse with intoxication, unspecified: Secondary | ICD-10-CM | POA: Diagnosis not present

## 2023-08-17 DIAGNOSIS — F10920 Alcohol use, unspecified with intoxication, uncomplicated: Secondary | ICD-10-CM

## 2023-08-17 DIAGNOSIS — Y908 Blood alcohol level of 240 mg/100 ml or more: Secondary | ICD-10-CM | POA: Insufficient documentation

## 2023-08-17 LAB — COMPREHENSIVE METABOLIC PANEL
ALT: 18 U/L (ref 0–44)
AST: 20 U/L (ref 15–41)
Albumin: 4.7 g/dL (ref 3.5–5.0)
Alkaline Phosphatase: 92 U/L (ref 38–126)
Anion gap: 11 (ref 5–15)
BUN: <5 mg/dL — ABNORMAL LOW (ref 6–20)
CO2: 30 mmol/L (ref 22–32)
Calcium: 9.2 mg/dL (ref 8.9–10.3)
Chloride: 99 mmol/L (ref 98–111)
Creatinine, Ser: 0.75 mg/dL (ref 0.61–1.24)
GFR, Estimated: >60 mL/min (ref >60)
Glucose, Bld: 115 mg/dL — ABNORMAL HIGH (ref 70–99)
Potassium: 3.8 mmol/L (ref 3.5–5.1)
Sodium: 140 mmol/L (ref 135–145)
Total Bilirubin: 0.9 mg/dL (ref 0.3–1.2)
Total Protein: 7.9 g/dL (ref 6.5–8.1)

## 2023-08-17 LAB — URINE DRUG SCREEN, QUALITATIVE (ARMC ONLY)
Amphetamines, Ur Screen: NOT DETECTED
Barbiturates, Ur Screen: NOT DETECTED
Benzodiazepine, Ur Scrn: NOT DETECTED
Cannabinoid 50 Ng, Ur ~~LOC~~: NOT DETECTED
Cocaine Metabolite,Ur ~~LOC~~: NOT DETECTED
MDMA (Ecstasy)Ur Screen: NOT DETECTED
Methadone Scn, Ur: NOT DETECTED
Opiate, Ur Screen: NOT DETECTED
Phencyclidine (PCP) Ur S: NOT DETECTED
Tricyclic, Ur Screen: NOT DETECTED

## 2023-08-17 LAB — CBC
HCT: 46.3 % (ref 39.0–52.0)
Hemoglobin: 16.1 g/dL (ref 13.0–17.0)
MCH: 30.9 pg (ref 26.0–34.0)
MCHC: 34.8 g/dL (ref 30.0–36.0)
MCV: 88.9 fL (ref 80.0–100.0)
Platelets: 259 10*3/uL (ref 150–400)
RBC: 5.21 MIL/uL (ref 4.22–5.81)
RDW: 12.4 % (ref 11.5–15.5)
WBC: 8.6 10*3/uL (ref 4.0–10.5)
nRBC: 0 % (ref 0.0–0.2)

## 2023-08-17 LAB — SALICYLATE LEVEL: Salicylate Lvl: <7 mg/dL — ABNORMAL LOW (ref 7.0–30.0)

## 2023-08-17 LAB — ETHANOL: Alcohol, Ethyl (B): 240 mg/dL — ABNORMAL HIGH (ref <10)

## 2023-08-17 LAB — ACETAMINOPHEN LEVEL: Acetaminophen (Tylenol), Serum: <10 ug/mL — ABNORMAL LOW (ref 10–30)

## 2023-08-17 MED ORDER — CHLORDIAZEPOXIDE HCL 25 MG PO CAPS
50.0000 mg | ORAL_CAPSULE | Freq: Once | ORAL | Status: AC
Start: 2023-08-17 — End: 2023-08-17
  Administered 2023-08-17: 50 mg via ORAL
  Filled 2023-08-17: qty 2

## 2023-08-17 MED ORDER — CHLORDIAZEPOXIDE HCL 25 MG PO CAPS: ORAL_CAPSULE | ORAL | 0 refills | Status: AC

## 2023-08-17 NOTE — ED Provider Notes (Signed)
[facility] Provider Note   Event Date/Time   First MD Initiated Contact with Patient 08/17/23 1655     (approximate) History  Drug / Alcohol Assessment  HPI Robert Briggs is a 27 y.o. male with stated past medical history of alcohol abuse who presents after consuming a fair amount of alcohol today while feeling alcohol withdrawal symptoms.  Patient states that he is a daily drinker and his last drink was approximately 2 hours prior to arrival.  Patient now complains of increasing anxiety, palpitations, and mild lightheadedness. ROS: Patient currently denies any vision changes, tinnitus, difficulty speaking, facial droop, sore throat, chest pain, shortness of breath, abdominal pain, nausea/vomiting/diarrhea, dysuria, or weakness/numbness/paresthesias in any extremity   Physical Exam  Triage Vital Signs: ED Triage Vitals  Encounter Vitals Group     BP 08/17/23 1451 (!) 141/97     Systolic BP Percentile --      Diastolic BP Percentile --      Pulse Rate 08/17/23 1451 100     Resp 08/17/23 1451 16     Temp 08/17/23 1451 97.6 F (36.4 C)     Temp src --      SpO2 08/17/23 1451 100 %     Weight 08/17/23 1451 150 lb (68 kg)     Height 08/17/23 1451 6\' 2"  (1.88 m)     Head Circumference --      Peak Flow --      Pain Score 08/17/23 1451 7     Pain Loc --      Pain Education --      Exclude from Growth Chart --    Most recent vital signs: Vitals:   08/17/23 1451  BP: (!) 141/97  Pulse: 100  Resp: 16  Temp: 97.6 F (36.4 C)  SpO2: 100%   General: Awake, oriented x4. CV:  Good peripheral perfusion.  Resp:  Normal effort.  Abd:  No distention.  Other:  Disheveled young adult underweight Caucasian male resting comfortably in no acute distress ED Results / Procedures / Treatments  Labs (all labs ordered are listed, but only abnormal results are displayed) Labs Reviewed  COMPREHENSIVE METABOLIC PANEL - Abnormal; Notable for the following  components:      Result Value   Glucose, Bld 115 (*)    BUN <5 (*)    All other components within normal limits  ETHANOL - Abnormal; Notable for the following components:   Alcohol, Ethyl (B) 240 (*)    All other components within normal limits  SALICYLATE LEVEL - Abnormal; Notable for the following components:   Salicylate Lvl <7.0 (*)    All other components within normal limits  ACETAMINOPHEN LEVEL - Abnormal; Notable for the following components:   Acetaminophen (Tylenol), Serum <10 (*)    All other components within normal limits  CBC  URINE DRUG SCREEN, QUALITATIVE ([facility] ONLY)   PROCEDURES: Critical Care performed: No .1-3 Lead EKG Interpretation  Performed by: Merwyn Katos, MD Authorized by: Merwyn Katos, MD     Interpretation: normal     ECG rate:  71   ECG rate assessment: normal     Rhythm: sinus rhythm     Ectopy: none     Conduction: normal    MEDICATIONS ORDERED IN ED: Medications - No data to display IMPRESSION / MDM / ASSESSMENT AND PLAN / ED COURSE  I reviewed the triage vital signs and the nursing notes.  The patient is on the cardiac monitor to evaluate for evidence of arrhythmia and/or significant heart rate changes. Patient's presentation is most consistent with acute presentation with potential threat to life or bodily function. Presents with mild altered mental status. +Slurred, sluggish behavior. Stated EtOH intoxication. Airway maintained. Unlikely intracranial bleed, opioid intoxication or coingestion, sepsis, hypothyroidism. Suspect likely transient course of intoxication with expected  improvement of symptoms as patient metabolizes offending agent.  Plan: frequent reassessments  Reassessment Note: Time: 3 hours since initial presentation. Evaluation: Frequent mental status exams showed improving symptoms and evidence that the patient's AMS was secondary to intoxication. Pt able to ambulate without difficulty  and PO tolerant. Plan DC home with ride and return precautions.  Given resource list for inpatient and outpatient detoxification facilities as well as a Librium taper. Disposition: Discharge home    FINAL CLINICAL IMPRESSION(S) / ED DIAGNOSES   Final diagnoses:  None   Rx / DC Orders   ED Discharge Orders     None      Note:  This document was prepared using Dragon voice recognition software and may include unintentional dictation errors.   Merwyn Katos, MD 08/17/23 [phone]

## 2023-08-17 NOTE — ED Triage Notes (Signed)
Pt sts that he is here for alcohol detox. Pt sts that his last time of drink was at 1300 today and that was hand sanitizer.

## 2023-08-17 NOTE — ED Notes (Signed)
Pt verbalizes understanding of discharge instructions. Opportunity for questioning and answers were provided. Pt discharged from ED to home with friend. Pt denied SI or HI when asked by this nurse. Pt and friend reported wanting resources for detox centers and substance abuse centers. While here in ED friend was actively making phone calls to different facilities. Pt and friend did verbalize that they would be going to [facility] if none of the other local facilities are able to take patient. Pt calm and cooperative during ED visit.

## 2023-11-15 ENCOUNTER — Encounter: Payer: Self-pay | Admitting: Medical Oncology

## 2023-11-15 ENCOUNTER — Other Ambulatory Visit: Payer: Self-pay

## 2023-11-15 ENCOUNTER — Emergency Department
Admission: EM | Admit: 2023-11-15 | Discharge: 2023-11-16 | Disposition: A | Discharge status: Home / Self Care | Payer: MEDICAID | Attending: Emergency Medicine | Admitting: Emergency Medicine

## 2023-11-15 DIAGNOSIS — F10120 Alcohol abuse with intoxication, uncomplicated: Secondary | ICD-10-CM | POA: Diagnosis present

## 2023-11-15 DIAGNOSIS — Y908 Blood alcohol level of 240 mg/100 ml or more: Secondary | ICD-10-CM | POA: Insufficient documentation

## 2023-11-15 DIAGNOSIS — F10920 Alcohol use, unspecified with intoxication, uncomplicated: Secondary | ICD-10-CM

## 2023-11-15 LAB — CBC
HCT: 50.3 % (ref 39.0–52.0)
Hemoglobin: 17.6 g/dL — ABNORMAL HIGH (ref 13.0–17.0)
MCH: 31.7 pg (ref 26.0–34.0)
MCHC: 35 g/dL (ref 30.0–36.0)
MCV: 90.5 fL (ref 80.0–100.0)
Platelets: 199 10*3/uL (ref 150–400)
RBC: 5.56 MIL/uL (ref 4.22–5.81)
RDW: 12 % (ref 11.5–15.5)
WBC: 4.6 10*3/uL (ref 4.0–10.5)
nRBC: 0 % (ref 0.0–0.2)

## 2023-11-15 LAB — COMPREHENSIVE METABOLIC PANEL
ALT: 19 U/L (ref 0–44)
AST: 21 U/L (ref 15–41)
Albumin: 4.8 g/dL (ref 3.5–5.0)
Alkaline Phosphatase: 79 U/L (ref 38–126)
Anion gap: 17 — ABNORMAL HIGH (ref 5–15)
BUN: 12 mg/dL (ref 6–20)
CO2: 22 mmol/L (ref 22–32)
Calcium: 8.7 mg/dL — ABNORMAL LOW (ref 8.9–10.3)
Chloride: 104 mmol/L (ref 98–111)
Creatinine, Ser: 0.67 mg/dL (ref 0.61–1.24)
GFR, Estimated: >60 mL/min (ref >60)
Glucose, Bld: 104 mg/dL — ABNORMAL HIGH (ref 70–99)
Potassium: 3.7 mmol/L (ref 3.5–5.1)
Sodium: 143 mmol/L (ref 135–145)
Total Bilirubin: 0.5 mg/dL (ref 0.0–1.2)
Total Protein: 7.9 g/dL (ref 6.5–8.1)

## 2023-11-15 LAB — ETHANOL: Alcohol, Ethyl (B): 413 mg/dL (ref <10)

## 2023-11-15 MED ORDER — HYDROXYZINE HCL 25 MG PO TABS
25.0000 mg | ORAL_TABLET | Freq: Once | ORAL | Status: AC
Start: 2023-11-15 — End: 2023-11-15
  Administered 2023-11-15: 25 mg via ORAL
  Filled 2023-11-15: qty 1

## 2023-11-15 MED ORDER — SODIUM CHLORIDE 0.9 % IV BOLUS
1000.0000 mL | Freq: Once | INTRAVENOUS | Status: AC
  Administered 2023-11-15: 1000 mL via INTRAVENOUS

## 2023-11-15 NOTE — ED Triage Notes (Signed)
 Pt to ED via EMS from [facility], pt was in rehab and signed self out 3 days ago for ETOH abuse, went on a ETOH bender for past 3 days, friends dropped pt off at another friends house and called 911. Pt responsive to verbal stimuli.

## 2023-11-15 NOTE — ED Notes (Signed)
 Writer attempted to call [facility] to update on patient being up for discharge. Left VM.

## 2023-11-15 NOTE — ED Notes (Signed)
 Belongings include:  1 brown long sleeve shirt 1 gray short sleeve shirt  1 pair of blue jeans 1 black hat

## 2023-11-15 NOTE — ED Notes (Signed)
 Patient complaining of increased anxiety. MD notified. Vital signs obtained. CIWA 3. PRN atarax given. Patient moved to room. Resting in bed at this time.

## 2023-11-15 NOTE — ED Notes (Signed)
Pt  vol 
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
## 2023-11-15 NOTE — ED Notes (Signed)
 Blood sent to the lab

## 2023-11-15 NOTE — ED Provider Notes (Signed)
 [facility] Provider Note    Event Date/Time   First MD Initiated Contact with Patient 11/15/23 1720     (approximate)   History   Alcohol Intoxication   HPI Robert Briggs is a 28 y.o. male with history of alcohol abuse presenting today for alcohol intoxication.  Patient was brought by friend's house for significant alcohol use over the past 3 days.  Was reportedly in rehab but signed himself out 3 days ago.  Was found to be very lethargic.  Given Zofran  with EMS.  On arrival here, he is very drowsy but answers questions otherwise appropriately.  States he drank a lot.  Denies any other drug use.  Currently denying any other symptoms.     Physical Exam   Triage Vital Signs: ED Triage Vitals [11/15/23 1716]  Encounter Vitals Group     BP (!) 129/99     Systolic BP Percentile      Diastolic BP Percentile      Pulse Rate (!) 112     Resp 20     Temp 98 F (36.7 C)     Temp Source Axillary     SpO2 93 %     Weight      Height      Head Circumference      Peak Flow      Pain Score      Pain Loc      Pain Education      Exclude from Growth Chart     Most recent vital signs: Vitals:   11/15/23 2131 11/15/23 2133  BP: 107/76 107/76  Pulse: (!) 102 (!) 102  Resp: 16   Temp:    SpO2: 100%    I have reviewed the vital signs. General:  Awake, but drowsy.  Notably intoxicated Head:  Normocephalic, Atraumatic. EENT:  PERRL, EOMI, Oral mucosa pink and moist, Neck is supple. Cardiovascular: Regular rate, 2+ distal pulses. Respiratory:  Normal respiratory effort, symmetrical expansion, no distress.   Extremities:  Moving all four extremities through full ROM without pain.   Neuro:  Alert and oriented.  Interacting appropriately.   Skin:  Warm, dry, no rash.   Psych: Appropriate affect.    ED Results / Procedures / Treatments   Labs (all labs ordered are listed, but only abnormal results are displayed) Labs Reviewed  COMPREHENSIVE  METABOLIC PANEL - Abnormal; Notable for the following components:      Result Value   Glucose, Bld 104 (*)    Calcium 8.7 (*)    Anion gap 17 (*)    All other components within normal limits  ETHANOL - Abnormal; Notable for the following components:   Alcohol, Ethyl (B) 413 (*)    All other components within normal limits  CBC - Abnormal; Notable for the following components:   Hemoglobin 17.6 (*)    All other components within normal limits  URINE DRUG SCREEN, QUALITATIVE ([facility] ONLY)     EKG    RADIOLOGY    PROCEDURES:  Critical Care performed: No  Procedures   MEDICATIONS ORDERED IN ED: Medications  sodium chloride  0.9 % bolus 1,000 mL (0 mLs Intravenous Stopped 11/15/23 1948)  hydrOXYzine  (ATARAX ) tablet 25 mg (25 mg Oral Given 11/15/23 2143)     IMPRESSION / MDM / ASSESSMENT AND PLAN / ED COURSE  I reviewed the triage vital signs and the nursing notes.  Differential diagnosis includes, but is not limited to, alcohol intoxication  Patient's presentation is most consistent with acute complicated illness / injury requiring diagnostic workup.  Patient is a 28 year old male with history of alcohol abuse presenting today for alcohol intoxication.  Patient is notably intoxicated on arrival but denies any other acute complaints.  Will monitor until he has metabolized alcohol and is functionally sober.  Patient signed out to coming provider pending metabolization.      FINAL CLINICAL IMPRESSION(S) / ED DIAGNOSES   Final diagnoses:  Alcoholic intoxication without complication (HCC)     Rx / DC Orders   ED Discharge Orders     None        Note:  This document was prepared using Dragon voice recognition software and may include unintentional dictation errors.   Malvina Alm DASEN, MD 11/15/23 2325

## 2023-11-15 NOTE — ED Provider Notes (Addendum)
 Assessed, sitting up in bed, answering questions appropriately, ate his entire tray of food.  Appears sober for discharge.  Gave permission to call his parents for a ride.  Discharge.  Will give alcohol use resources.  Father concerned given his self-destructive behavior and alcohol use ongoing. Requesting psychiatric evaluation for depression.  I spoke with the patient he does note depression and passive suicidal ideation but no active suicidal ideation or plan.  No hallucinations or homicidal ideation.  He does not want to stay for psychiatric evaluation and I do not see a reason to IVC him.  Plan will be for discharge.   Cyrena Mylar, MD 11/15/23 2351    Cyrena Mylar, MD 11/16/23 [phone]

## 2023-11-16 NOTE — ED Notes (Signed)
 Writer spoke with patients father about patient being up for discharge. States patient will not follow up if given outpatient information. Reports patient has a illness and needs help.

## 2023-11-16 NOTE — ED Notes (Addendum)
 Patient belongings returned. IV removed. Patient given discharge paperwork. Discharged to lobby.

## 2023-11-16 NOTE — ED Notes (Signed)
 Patient reports he does not have a ride to pick him up.

## 2024-11-07 ENCOUNTER — Emergency Department
Admission: EM | Admit: 2024-11-07 | Discharge: 2024-11-08 | Disposition: A | Discharge status: Other Acute Inpatient Hospital | Payer: Self-pay | Attending: Emergency Medicine | Admitting: Emergency Medicine

## 2024-11-07 ENCOUNTER — Other Ambulatory Visit: Payer: Self-pay

## 2024-11-07 DIAGNOSIS — F1090 Alcohol use, unspecified, uncomplicated: Secondary | ICD-10-CM

## 2024-11-07 DIAGNOSIS — F419 Anxiety disorder, unspecified: Secondary | ICD-10-CM

## 2024-11-07 DIAGNOSIS — Y908 Blood alcohol level of 240 mg/100 ml or more: Secondary | ICD-10-CM | POA: Insufficient documentation

## 2024-11-07 LAB — COMPREHENSIVE METABOLIC PANEL WITH GFR
ALT: 14 U/L (ref 0–44)
AST: 20 U/L (ref 15–41)
Albumin: 4.9 g/dL (ref 3.5–5.0)
Alkaline Phosphatase: 73 U/L (ref 38–126)
Anion gap: 16 — ABNORMAL HIGH (ref 5–15)
BUN: 10 mg/dL (ref 6–20)
CO2: 26 mmol/L (ref 22–32)
Calcium: 9.2 mg/dL (ref 8.9–10.3)
Chloride: 104 mmol/L (ref 98–111)
Creatinine, Ser: 0.89 mg/dL (ref 0.61–1.24)
GFR, Estimated: >60 mL/min
Glucose, Bld: 112 mg/dL — ABNORMAL HIGH (ref 70–99)
Potassium: 3.9 mmol/L (ref 3.5–5.1)
Sodium: 145 mmol/L (ref 135–145)
Total Bilirubin: 0.5 mg/dL (ref 0.0–1.2)
Total Protein: 7.3 g/dL (ref 6.5–8.1)

## 2024-11-07 LAB — SALICYLATE LEVEL: Salicylate Lvl: <7 mg/dL — ABNORMAL LOW (ref 7.0–30.0)

## 2024-11-07 LAB — CBC
HCT: 46.3 % (ref 39.0–52.0)
Hemoglobin: 16.1 g/dL (ref 13.0–17.0)
MCH: 31.4 pg (ref 26.0–34.0)
MCHC: 34.8 g/dL (ref 30.0–36.0)
MCV: 90.4 fL (ref 80.0–100.0)
Platelets: 237 K/uL (ref 150–400)
RBC: 5.12 MIL/uL (ref 4.22–5.81)
RDW: 11.8 % (ref 11.5–15.5)
WBC: 6.5 K/uL (ref 4.0–10.5)
nRBC: 0 % (ref 0.0–0.2)

## 2024-11-07 LAB — URINE DRUG SCREEN
Amphetamines: NEGATIVE
Barbiturates: NEGATIVE
Benzodiazepines: NEGATIVE
Cocaine: NEGATIVE
Fentanyl: NEGATIVE
Methadone Scn, Ur: NEGATIVE
Opiates: NEGATIVE
Tetrahydrocannabinol: NEGATIVE

## 2024-11-07 LAB — ACETAMINOPHEN LEVEL: Acetaminophen (Tylenol), Serum: <10 ug/mL — ABNORMAL LOW (ref 10–30)

## 2024-11-07 LAB — ETHANOL: Alcohol, Ethyl (B): 324 mg/dL

## 2024-11-07 MED ORDER — LORAZEPAM 2 MG/ML IJ SOLN: 0.0000 mg | Freq: Four times a day (QID) | INTRAMUSCULAR | Status: DC

## 2024-11-07 MED ORDER — LORAZEPAM 2 MG/ML IJ SOLN: 2.0000 mg | Freq: Once | INTRAMUSCULAR | Status: DC

## 2024-11-07 MED ORDER — THIAMINE MONONITRATE 100 MG PO TABS
100.0000 mg | ORAL_TABLET | Freq: Every day | ORAL | Status: DC
  Administered 2024-11-08: 100 mg via ORAL
  Filled 2024-11-07: qty 1

## 2024-11-07 MED ORDER — LORAZEPAM 2 MG PO TABS
0.0000 mg | ORAL_TABLET | Freq: Four times a day (QID) | ORAL | Status: DC
  Administered 2024-11-07: 2 mg via ORAL
  Administered 2024-11-08: 1 mg via ORAL
  Filled 2024-11-07 (×2): qty 1

## 2024-11-07 MED ORDER — LORAZEPAM 2 MG/ML IJ SOLN: 0.0000 mg | Freq: Two times a day (BID) | INTRAMUSCULAR | Status: DC

## 2024-11-07 MED ORDER — LORAZEPAM 2 MG PO TABS: 0.0000 mg | ORAL_TABLET | Freq: Two times a day (BID) | ORAL | Status: DC

## 2024-11-07 MED ORDER — THIAMINE HCL 100 MG/ML IJ SOLN: 100.0000 mg | Freq: Every day | INTRAMUSCULAR | Status: DC

## 2024-11-07 MED ORDER — SODIUM CHLORIDE 0.9 % IV BOLUS
1000.0000 mL | Freq: Once | INTRAVENOUS | Status: AC
  Administered 2024-11-07: 1000 mL via INTRAVENOUS

## 2024-11-07 NOTE — ED Provider Notes (Signed)
 "  [facility] Provider Note    Event Date/Time   First MD Initiated Contact with Patient 11/07/24 2153     (approximate)   History   Psychiatric Evaluation   HPI  Robert Briggs is a 28 y.o. male with a history of alcohol abuse who presents with alcohol intoxication.  He is under involuntary commitment..  IVC paperwork, the patient has been recently at a motel drinking very heavily for the last several days.  He stated that he felt he was going to kill himself.  The patient denies SI to me currently.  He denies any other acute complaints.  He endorses alcohol use but denies drugs.  I reviewed the past medical records.  The patient was seen in the ED on 1/6 of this year with alcohol intoxication.  He has multiple other prior ED visits for alcohol intoxication.  I do not see any inpatient psychiatric admissions.   Physical Exam   Triage Vital Signs: ED Triage Vitals  Encounter Vitals Group     BP 11/07/24 1956 (!) 122/106     Girls Systolic BP Percentile --      Girls Diastolic BP Percentile --      Boys Systolic BP Percentile --      Boys Diastolic BP Percentile --      Pulse Rate 11/07/24 1956 (!) 130     Resp 11/07/24 1956 20     Temp 11/07/24 1956 98.5 F (36.9 C)     Temp src --      SpO2 11/07/24 1956 98 %     Weight 11/07/24 1955 150 lb (68 kg)     Height 11/07/24 1955 6' 2 (1.88 m)     Head Circumference --      Peak Flow --      Pain Score 11/07/24 1955 0     Pain Loc --      Pain Education --      Exclude from Growth Chart --     Most recent vital signs: Vitals:   11/07/24 1956 11/07/24 2206  BP: (!) 122/106 (!) 122/106  Pulse: (!) 130 (!) 130  Resp: 20   Temp: 98.5 F (36.9 C)   SpO2: 98%      General: Alert, intoxicated appearing, no distress.  CV:  Good peripheral perfusion.  Resp:  Normal effort.  Abd:  No distention.  Other:  EOMI.  PERRLA.  No facial droop.  Motor intact in all extremities.   ED Results /  Procedures / Treatments   Labs (all labs ordered are listed, but only abnormal results are displayed) Labs Reviewed  COMPREHENSIVE METABOLIC PANEL WITH GFR - Abnormal; Notable for the following components:      Result Value   Glucose, Bld 112 (*)    Anion gap 16 (*)    All other components within normal limits  ETHANOL - Abnormal; Notable for the following components:   Alcohol, Ethyl (B) 324 (*)    All other components within normal limits  SALICYLATE LEVEL - Abnormal; Notable for the following components:   Salicylate Lvl <7.0 (*)    All other components within normal limits  ACETAMINOPHEN  LEVEL - Abnormal; Notable for the following components:   Acetaminophen  (Tylenol ), Serum <10 (*)    All other components within normal limits  CBC  URINE DRUG SCREEN     EKG    RADIOLOGY    PROCEDURES:  Critical Care performed: No  Procedures   MEDICATIONS  ORDERED IN ED: Medications  LORazepam  (ATIVAN ) injection 0-4 mg ( Intravenous See Alternative 11/07/24 2257)    Or  LORazepam  (ATIVAN ) tablet 0-4 mg (2 mg Oral Given 11/07/24 2257)  LORazepam  (ATIVAN ) injection 0-4 mg (has no administration in time range)    Or  LORazepam  (ATIVAN ) tablet 0-4 mg (has no administration in time range)  thiamine  (VITAMIN B1) tablet 100 mg (has no administration in time range)    Or  thiamine  (VITAMIN B1) injection 100 mg (has no administration in time range)  LORazepam  (ATIVAN ) injection 2 mg (2 mg Intravenous Not Given 11/07/24 2310)  sodium chloride  0.9 % bolus 1,000 mL (1,000 mLs Intravenous New Bag/Given 11/07/24 2258)     IMPRESSION / MDM / ASSESSMENT AND PLAN / ED COURSE  I reviewed the triage vital signs and the nursing notes.  28 year old male with PMH as noted above presents with heavy alcohol use as well as stating that he felt like he was going to kill himself.  He denies SI currently.  On exam he is tachycardic.  Other vital signs are normal.  Neurologic exam is nonfocal and  there is no visible trauma.  Initial lab workup is significant for an ethanol level of 324.  APAP and ASA levels are negative.  UDS is negative.  CMP and CBC show no acute findings.  Differential diagnosis includes, but is not limited to, alcohol use disorder, major depressive disorder, adjustment disorder, substance-induced mood disorder.  I have ordered psychiatry and TTS consults given that the patient has been placed under IVC.  Patient's presentation is most consistent with acute presentation with potential threat to life or bodily function.  The patient has been placed in psychiatric observation due to the need to provide a safe environment for the patient while obtaining psychiatric consultation and evaluation, as well as ongoing medical and medication management to treat the patient's condition.  The patient has been placed under full IVC at this time.   ----------------------------------------- 11:18 PM on 11/07/2024 -----------------------------------------  I have ordered the patient for CIWA protocol.  I have signed him out to the oncoming ED physician Dr. Gordan.   FINAL CLINICAL IMPRESSION(S) / ED DIAGNOSES   Final diagnoses:  Alcohol use disorder     Rx / DC Orders   ED Discharge Orders     None        Note:  This document was prepared using Dragon voice recognition software and may include unintentional dictation errors.    Jacolyn Pae, MD 11/07/24 2318  "

## 2024-11-07 NOTE — ED Triage Notes (Signed)
 Pt to ED via [facility] under IVC, pt reports drinking a lot of beer liquor and wine. Pt is [facility] [place] (rehab) but has been gone for 3 days. Pt has been staying at a motel in [place] drinking and made comments he was going to harm himself. Pt currently denies SI. Pt appears intoxicated. Pt is calm and cooperative.

## 2024-11-07 NOTE — ED Notes (Signed)
 Pt belongings:  Astronomer pants [employer] Black socks [employer]

## 2024-11-08 ENCOUNTER — Other Ambulatory Visit (HOSPITAL_COMMUNITY)
Admission: EM | Admit: 2024-11-08 | Discharge: 2024-11-10 | Disposition: A | Discharge status: Home / Self Care | Payer: Self-pay | Source: Intra-hospital | Attending: Psychiatry | Admitting: Psychiatry

## 2024-11-08 DIAGNOSIS — F10982 Alcohol use, unspecified with alcohol-induced sleep disorder: Secondary | ICD-10-CM

## 2024-11-08 DIAGNOSIS — F17210 Nicotine dependence, cigarettes, uncomplicated: Secondary | ICD-10-CM | POA: Insufficient documentation

## 2024-11-08 DIAGNOSIS — F32A Depression, unspecified: Secondary | ICD-10-CM | POA: Insufficient documentation

## 2024-11-08 DIAGNOSIS — F411 Generalized anxiety disorder: Secondary | ICD-10-CM | POA: Insufficient documentation

## 2024-11-08 DIAGNOSIS — F10280 Alcohol dependence with alcohol-induced anxiety disorder: Secondary | ICD-10-CM | POA: Insufficient documentation

## 2024-11-08 DIAGNOSIS — F1090 Alcohol use, unspecified, uncomplicated: Secondary | ICD-10-CM

## 2024-11-08 DIAGNOSIS — F10282 Alcohol dependence with alcohol-induced sleep disorder: Secondary | ICD-10-CM | POA: Insufficient documentation

## 2024-11-08 DIAGNOSIS — Z8659 Personal history of other mental and behavioral disorders: Secondary | ICD-10-CM | POA: Insufficient documentation

## 2024-11-08 DIAGNOSIS — R45851 Suicidal ideations: Secondary | ICD-10-CM | POA: Insufficient documentation

## 2024-11-08 DIAGNOSIS — F419 Anxiety disorder, unspecified: Secondary | ICD-10-CM

## 2024-11-08 LAB — ETHANOL: Alcohol, Ethyl (B): <15 mg/dL

## 2024-11-08 LAB — HEMOGLOBIN A1C
Hgb A1c MFr Bld: 5 % (ref 4.8–5.6)
Mean Plasma Glucose: 96.8 mg/dL

## 2024-11-08 LAB — MAGNESIUM: Magnesium: 2 mg/dL (ref 1.7–2.4)

## 2024-11-08 LAB — LIPID PANEL
Cholesterol: 161 mg/dL (ref 0–200)
HDL: 79 mg/dL
LDL Cholesterol: 68 mg/dL (ref 0–99)
Total CHOL/HDL Ratio: 2.1 ratio
Triglycerides: 71 mg/dL
VLDL: 14 mg/dL (ref 0–40)

## 2024-11-08 LAB — TSH: TSH: 0.682 u[IU]/mL (ref 0.350–4.500)

## 2024-11-08 LAB — VITAMIN B12: Vitamin B-12: 399 pg/mL (ref 180–914)

## 2024-11-08 MED ORDER — ACETAMINOPHEN 325 MG PO TABS: 650.0000 mg | ORAL_TABLET | Freq: Four times a day (QID) | ORAL | Status: DC | PRN

## 2024-11-08 MED ORDER — HALOPERIDOL 5 MG PO TABS: 5.0000 mg | ORAL_TABLET | Freq: Three times a day (TID) | ORAL | Status: DC | PRN

## 2024-11-08 MED ORDER — THIAMINE HCL 100 MG/ML IJ SOLN: 100.0000 mg | Freq: Every day | INTRAMUSCULAR | Status: DC

## 2024-11-08 MED ORDER — DIPHENHYDRAMINE HCL 50 MG/ML IJ SOLN: 50.0000 mg | Freq: Three times a day (TID) | INTRAMUSCULAR | Status: DC | PRN

## 2024-11-08 MED ORDER — LORAZEPAM 2 MG/ML IJ SOLN: 2.0000 mg | Freq: Three times a day (TID) | INTRAMUSCULAR | Status: DC | PRN

## 2024-11-08 MED ORDER — TRAZODONE HCL 50 MG PO TABS: 50.0000 mg | ORAL_TABLET | Freq: Every day | ORAL | Status: DC

## 2024-11-08 MED ORDER — ALUM & MAG HYDROXIDE-SIMETH 200-200-20 MG/5ML PO SUSP: 30.0000 mL | ORAL | Status: DC | PRN

## 2024-11-08 MED ORDER — HYDROXYZINE HCL 25 MG PO TABS
25.0000 mg | ORAL_TABLET | Freq: Three times a day (TID) | ORAL | Status: DC | PRN
  Administered 2024-11-08 (×2): 25 mg via ORAL
  Filled 2024-11-08 (×2): qty 1

## 2024-11-08 MED ORDER — LORAZEPAM 2 MG/ML IJ SOLN: 0.0000 mg | Freq: Four times a day (QID) | INTRAMUSCULAR | Status: DC

## 2024-11-08 MED ORDER — LORAZEPAM 2 MG/ML IJ SOLN: 0.0000 mg | Freq: Two times a day (BID) | INTRAMUSCULAR | Status: DC

## 2024-11-08 MED ORDER — LORAZEPAM 1 MG PO TABS: 0.0000 mg | ORAL_TABLET | Freq: Two times a day (BID) | ORAL | Status: DC

## 2024-11-08 MED ORDER — HALOPERIDOL LACTATE 5 MG/ML IJ SOLN: 10.0000 mg | Freq: Three times a day (TID) | INTRAMUSCULAR | Status: DC | PRN

## 2024-11-08 MED ORDER — DIPHENHYDRAMINE HCL 50 MG PO CAPS: 50.0000 mg | ORAL_CAPSULE | Freq: Three times a day (TID) | ORAL | Status: DC | PRN

## 2024-11-08 MED ORDER — LORAZEPAM 1 MG PO TABS: 1.0000 mg | ORAL_TABLET | Freq: Four times a day (QID) | ORAL | Status: DC | PRN

## 2024-11-08 MED ORDER — HALOPERIDOL LACTATE 5 MG/ML IJ SOLN: 5.0000 mg | Freq: Three times a day (TID) | INTRAMUSCULAR | Status: DC | PRN

## 2024-11-08 MED ORDER — HYDROXYZINE HCL 25 MG PO TABS
25.0000 mg | ORAL_TABLET | Freq: Three times a day (TID) | ORAL | Status: DC | PRN
  Administered 2024-11-09: 25 mg via ORAL
  Filled 2024-11-08: qty 1

## 2024-11-08 MED ORDER — TRAZODONE HCL 50 MG PO TABS
50.0000 mg | ORAL_TABLET | Freq: Every day | ORAL | Status: DC
  Administered 2024-11-08 – 2024-11-09 (×2): 50 mg via ORAL
  Filled 2024-11-08 (×2): qty 1

## 2024-11-08 MED ORDER — LORAZEPAM 1 MG PO TABS: 0.0000 mg | ORAL_TABLET | Freq: Four times a day (QID) | ORAL | Status: DC

## 2024-11-08 MED ORDER — THIAMINE MONONITRATE 100 MG PO TABS
100.0000 mg | ORAL_TABLET | Freq: Every day | ORAL | Status: DC
  Administered 2024-11-09: 100 mg via ORAL
  Filled 2024-11-08 (×2): qty 1

## 2024-11-08 MED ORDER — MAGNESIUM HYDROXIDE 400 MG/5ML PO SUSP: 30.0000 mL | Freq: Every day | ORAL | Status: DC | PRN

## 2024-11-08 NOTE — BH Assessment (Signed)
 Comprehensive Clinical Assessment (CCA) Screening, Triage and Referral Note  11/08/2024 Robert Briggs [id number] Recommendations for Services/Supports/Treatments: Psych consult/Disposition pending. Robert Briggs is a 28 year old, English speaking, Caucasian male. Pt presented to [facility] ED under IVC. Per triage note: Pt to ED via [facility] under IVC, pt reports drinking a lot of beer liquor and wine. Pt [facility] [place] ([facility]) but has been gone for 3 days. Pt has been staying at a motel in [place] drinking and made comments he was going to harm himself. Pt currently denies SI. Pt appears intoxicated. Pt is calm and cooperative.  On assessment the pt. admitted that he recently relapsed and drank a couple bottles of wine prior to arrival. The pt reported that he'd recently been living at the [facility] and does not know why he'd gone on a bender. Pt's reported that he'd been sober about 4 months prior to his recent relapse. The pt. reported having complicated withdrawal symptoms stating, It just makes me feel bad.; however, pt did not elaborate. The pt. had fair insight and impaired judgement. Pt was in the precontemplation stage of change, as he admitted that his substance use is problematic. Pt was not expansive about his next steps; however. Pt was lethargic. Pt was not responding to internal/external stimuli. Pt mumbled with, slurred speech; however, his thoughts were linear. Pt was oriented x4. Pt presented with a depressed mood; affect was congruent. Pt had a disheveled appearance. Pt's BAL was 324 on arrival. UDS is unremarkable. Pt denied current SI/HI/AV/H.  Chief Complaint:  Chief Complaint  Patient presents with   Psychiatric Evaluation   Visit Diagnosis: Alcohol use disorder, severe  Patient Reported Information How did you hear about us ? Other (Comment) (EMS)  What Is the Reason for Your Visit/Call Today? Pt to ED via EMS from friends house, pt was  in rehab and signed self out 3 days ago for ETOH abuse, went on a ETOH bender for past 3 days, friends dropped pt off at another friends house and called 911.  How Long Has This Been Causing You Problems? > than 6 months  What Do You Feel Would Help You the Most Today? Alcohol or Drug Use Treatment   Have You Recently Had Any Thoughts About Hurting Yourself? No  Are You Planning to Commit Suicide/Harm Yourself At This time? No   Have you Recently Had Thoughts About Hurting Someone Sherral? No  Are You Planning to Harm Someone at This Time? No  Explanation: Pt denies.   Have You Used Any Alcohol or Drugs in the Past 24 Hours? No data recorded How Long Ago Did You Use Drugs or Alcohol? No data recorded What Did You Use and How Much? No data recorded  Do You Currently Have a Therapist/Psychiatrist? No data recorded Name of Therapist/Psychiatrist: [facility]TA   Have You Been Recently Discharged From Any Office Practice or Programs? Yes  Explanation of Discharge From Practice/Program: Pt signed himself out of rehab 3 days ago.    CCA Screening Triage Referral Assessment Type of Contact: Face-to-Face  Telemedicine Service Delivery:   Is this Initial or Reassessment?   Date Telepsych consult ordered in CHL:    Time Telepsych consult ordered in CHL:    Location of Assessment: [facility] ED  Provider Location: [facility] ED    Collateral Involvement: None provided   Does Patient Have a Court Appointed Legal Guardian? No data recorded Name and Contact of Legal Guardian: No data recorded If Minor  and Not Living with Parent(s), Who has Custody? n/a  Is CPS involved or ever been involved? Never  Is APS involved or ever been involved? Never   Patient Determined To Be At Risk for Harm To Self or Others Based on Review of Patient Reported Information or Presenting Complaint? No  Method: No Plan  Availability of Means: No access or NA  Intent: Vague intent or NA  Notification Required: No  need or identified person  Additional Information for Danger to Others Potential: -- (n/a)  Additional Comments for Danger to Others Potential: n/a  Are There Guns or Other Weapons in Your Home? No  Types of Guns/Weapons: n/a  Are These Weapons Safely Secured?                            No  Who Could Verify You Are Able To Have These Secured: n/a  Do You Have any Outstanding Charges, Pending Court Dates, Parole/Probation? None reported  Contacted To Inform of Risk of Harm To Self or Others: Other: Comment   Does Patient Present under Involuntary Commitment? No    [place] of Residence: [place]   Patient Currently Receiving the Following Services: Not Receiving Services   Determination of Need: Emergent (2 hours)   Options For Referral: Chemical Dependency Intensive Outpatient Therapy (CDIOP); ED Visit; Therapeutic Triage Services; Inpatient Hospitalization   Disposition Recommendation per psychiatric provider: Pending psych consult/disposition  Tyniah Kastens R Koleton Duchemin, LCAS

## 2024-11-08 NOTE — BH Assessment (Signed)
 Patient has been accepted to [facility] 11/08/24   Accepting physician is Dr. Cole.   Call report to [phone] or [phone]    Representative was [facility] Danika.       ER Staff is aware.    Address:  [address]                   [place], [place] [address]           Rosina PARAS, MA, [facility], [facility]

## 2024-11-08 NOTE — ED Provider Notes (Signed)
----------------------------------------- °  6:53 AM on 11/08/2024 -----------------------------------------  Patient has been evaluated by psychiatry and they are recommending inpatient psych placement for stabilization and treatment.   Gordan Huxley, MD 11/08/24 [phone]

## 2024-11-08 NOTE — Consult Note (Signed)
 [facility] Consult Note  Patient Name: Robert Briggs MRN: [id number] DOB: [birth year] DATE OF Consult: 11/08/2024 Consult Order details:  Orders (From admission, onward)     Start     Ordered   11/07/24 2213  CONSULT TO CALL ACT TEAM       Ordering Provider: Jacolyn Pae, MD  Provider:  (Not yet assigned)  Question:  Reason for Consult?  Answer:  Psych consult   11/07/24 2213   11/07/24 2213  IP CONSULT TO PSYCHIATRY       Ordering Provider: Jacolyn Pae, MD  Provider:  (Not yet assigned)  Question Answer Comment  Reason for consult: Other (see comments)   Comments: IVC      11/07/24 2213            PRIMARY PSYCHIATRIC DIAGNOSES  1.  Alcohol Use Disorder 2.  Anxiety disorder unspecified  RECOMMENDATIONS  Admit to inpatient psych for safety and stabilization  - Initiate inpatient detoxification with monitoring and medication as indicated for withdrawal symptoms.  Medication recommendations:  Trazodone 50mg  PO QHS for sleep. Hydroxyzine  25mg  PO TID PRN for anxiety  Non-Medication/therapeutic recommendations: - Encourage continued participation in AA following discharge. - Recommend ongoing engagement with sober living environment [facility]) and consideration of additional therapy as needed. - Monitor withdrawal symptoms during admission, provide supportive care, and reassess readiness for further treatment prior to discharge.   Communication: Treatment team members (and family members if applicable) who were involved in treatment/care discussions and planning, and with whom we spoke or engaged with via secure text/chat, include the following: patient's treatment team.  I personally spent a total of 45 minutes in the care of the patient today including preparing to see the patient, getting/reviewing separately obtained history, counseling and educating, referring and communicating with other health care professionals, documenting clinical  information in the EHR, and performing psych eval.  Thank you for involving us  in the care of this patient. If you have any additional questions or concerns, please call [phone] and ask for me or the provider on-call.  TELEPSYCHIATRY ATTESTATION & CONSENT  As the provider for this telehealth consult, I attest that I verified the patients identity using two separate identifiers, introduced myself to the patient, provided my credentials, disclosed my location, and performed this encounter via a HIPAA-compliant, real-time, face-to-face, two-way, interactive audio and video platform and with the full consent and agreement of the patient (or guardian as applicable.)  Patient physical location: [place]. Telehealth provider physical location: home office in state of [place].  Video start time: 0510 (Central Time) Video end time: 0524 (Central Time)  IDENTIFYING DATA  Robert Briggs is a 28 y.o. year-old male for whom a psychiatric consultation has been ordered by the primary provider. The patient was identified using two separate identifiers.  CHIEF COMPLAINT/REASON FOR CONSULT  I was just chilling. I had no intentions on, you know, killing myself or nothing like that. I just went there to drink.  HISTORY OF PRESENT ILLNESS (HPI)  Per ER note, patient is a 28 y.o. male with a history of alcohol abuse who presents with alcohol intoxication.  He is under involuntary commitment..  IVC paperwork, the patient has been recently at a motel drinking very heavily for the last several days.  He stated that he felt he was going to kill himself.  The patient denies SI to me currently.  He denies any other acute complaints.  He endorses alcohol use but denies drugs.  I reviewed the  past medical records.  The patient was seen in the ED on 1/6 of this year with alcohol intoxication.  He has multiple other prior ED visits for alcohol intoxication.  I do not see any inpatient psychiatric admissions.   During  interview, patient reports presenting to the ER following an episode of heavy alcohol consumption over the past several days, culminating in police involvement and subsequent hospital visit. It was noted that he had been residing at an [facility] for approximately four months, maintaining sobriety during that period until this recent relapse. The patient described experiencing a particularly anxious and sick feeling upon waking, which precipitated his decision to leave the sober living environment and obtain a motel room for the purpose of drinking. He reported consuming large quantities of wine and beer, with impaired recall regarding the exact amount, though laboratory results confirmed a significantly elevated blood alcohol level (324 mg/dL) upon presentation.  No suicidal ideation was endorsed, and he denied any intent to harm himself during the episode. He stated that his drinking was driven by urges and longstanding patterns, rather than acute depressive symptoms. Previous attempts at pharmacologic management of anxiety with benzodiazepines (Xanax, Klonopin ) were described as ineffective, leading to recurrent reliance on alcohol for symptom relief. He acknowledged a history of alcohol withdrawal seizures, with the most recent episode occurring approximately one year ago.  Sleep quality was described as variable, with intermittent disturbances, and appetite was noted to be reduced, limited to occasional snacking. The patient reported ongoing anxiety but is not currently prescribed any psychiatric medications. He has a history of rehabilitation for alcohol use, with the last admission about one year prior, and denied any prior psychiatric hospitalizations or suicide attempts. Family history is notable for maternal alcohol use but no known history of suicide or other substance abuse. Social support is present, primarily from his best friend and mother. 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 The patient expressed ambivalence regarding formal detoxification but recognized the need for ongoing support and participation in Alcoholics Anonymous.  PAST PSYCHIATRIC HISTORY  - No history of suicide attempts, or self-harm reported. - About one year ago, pt. completed a rehab program for alcohol use disorder. - Previously trialed Xanax and Klonopin  for anxiety; both medications were discontinued due to lack of sustained efficacy. Otherwise as per HPI above.  PAST MEDICAL HISTORY  Past Medical History:  Diagnosis Date   Alcohol abuse      HOME MEDICATIONS  Facility Ordered Medications  Medication   [COMPLETED] sodium chloride  0.9 % bolus 1,000 mL   LORazepam  (ATIVAN ) injection 0-4 mg   Or   LORazepam  (ATIVAN ) tablet 0-4 mg   [START ON 11/10/2024] LORazepam  (ATIVAN ) injection 0-4 mg   Or   [START ON 11/10/2024] LORazepam  (ATIVAN ) tablet 0-4 mg   thiamine  (VITAMIN B1) tablet 100 mg   Or   thiamine  (VITAMIN B1) injection 100 mg   LORazepam  (ATIVAN ) injection 2 mg   PTA Medications  Medication Sig   ondansetron  (ZOFRAN -ODT) 4 MG disintegrating tablet Take 1 tablet (4 mg total) by mouth every 8 (eight) hours as needed for nausea or vomiting. (Patient not taking: Reported on 11/17/2021)   buPROPion  (WELLBUTRIN  XL) 150 MG 24 hr tablet 1 po q am (Patient not taking: Reported on 11/15/2023)   hydrOXYzine  (ATARAX ) 25 MG tablet 1 po bid prn (Patient not taking: Reported on 11/15/2023)   buPROPion  (WELLBUTRIN  XL) 300 MG 24 hr tablet TAKE ONE TABLET BY MOUTH  EVERY MORNING (Patient not taking: Reported on 11/15/2023)   amphetamine-dextroamphetamine (ADDERALL) 10 MG tablet Take 10 mg by mouth 2 (two) times daily. (Patient not taking: Reported on 11/15/2023)   clonazePAM  (KLONOPIN ) 0.5 MG tablet Take 0.5 mg by mouth daily as needed. (Patient not taking: Reported on 11/15/2023)     ALLERGIES  Allergies[1]  SOCIAL & SUBSTANCE USE HISTORY  Social History   Socioeconomic  History   Marital status: Single    Spouse name: Not on file   Number of children: Not on file   Years of education: Not on file   Highest education level: Not on file  Occupational History   Not on file  Tobacco Use   Smoking status: Every Day    Types: Cigarettes   Smokeless tobacco: Never  Vaping Use   Vaping status: Never Used  Substance and Sexual Activity   Alcohol use: Yes    Comment: daily   Drug use: Not Currently   Sexual activity: Not on file  Other Topics Concern   Not on file  Social History Narrative   Not on file   Social Drivers of Health   Tobacco Use: High Risk (11/07/2024)   Patient History    Smoking Tobacco Use: Every Day    Smokeless Tobacco Use: Never    Passive Exposure: Not on file  Financial Resource Strain: Not on file  Food Insecurity: Not on file  Transportation Needs: Not on file  Physical Activity: Not on file  Stress: Not on file  Social Connections: Not on file  Depression (EYV7-0): Not on file  Alcohol Screen: Not on file  Housing: Not on file  Utilities: Not on file  Health Literacy: Not on file   Tobacco Use History[2] Social History   Substance and Sexual Activity  Alcohol Use Yes   Comment: daily   Social History   Substance and Sexual Activity  Drug Use Not Currently    He has been living at an [facility] for the past four months, following a period of staying at his grandparents' home. 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 His support system includes his best friend and his mother, who is aware of his drinking and also drinks. Alcohol use began at age 56, influenced by family. He attends AA and has access to therapy at [facility] if needed.  FAMILY HISTORY  History reviewed. No pertinent family history. Family Psychiatric History (if known):  Family history is notable for maternal alcohol use but no known history of suicide or other substance abuse.  MENTAL STATUS  EXAM (MSE)  Mental Status Exam: General Appearance: wearing hospital scrubs  Orientation:  Full (Time, Place, and Person)  Memory:  intact  Concentration:  fair  Recall:  Fair  Attention  Fair  Eye Contact:  Fair  Speech:  Clear and Coherent and Normal Rate  Language:  Good  Volume:  Normal  Mood: denies feeling depressed but reports feeling sad sometimes  Affect:  appears congruent with the content discussed; patient appears subdued and somewhat anxious when describing recent events  Thought Process:  Goal Directed and Linear  Thought Content:  There is no mention of auditory hallucinations, visual hallucinations, paranoia, obsessive thoughts, delusional thoughts, or intrusive thoughts.  Suicidal Thoughts:  No evidence of suicidal ideation. Patient stated, I was not suicidal, and denied any intent to harm himself.  Homicidal Thoughts:  No  Judgement:  Poor  Insight:  Lacking  Psychomotor  Activity:  Normal  Akathisia:  Negative  Fund of Knowledge:  Good    Assets:  Communication Skills Desire for Improvement Housing Social Support  Cognition:  WNL  ADL's:  Intact  AIMS (if indicated):       VITALS  Blood pressure 103/66, pulse 82, temperature 97.6 F (36.4 C), temperature source Oral, resp. rate 18, height 6' 2 (1.88 m), weight 68 kg, SpO2 95%.  LABS  Admission on 11/07/2024  Component Date Value Ref Range Status   Sodium 11/07/2024 145  135 - 145 mmol/L Final   Potassium 11/07/2024 3.9  3.5 - 5.1 mmol/L Final   Chloride 11/07/2024 104  98 - 111 mmol/L Final   CO2 11/07/2024 26  22 - 32 mmol/L Final   Glucose, Bld 11/07/2024 112 (H)  70 - 99 mg/dL Final   Glucose reference range applies only to samples taken after fasting for at least 8 hours.   BUN 11/07/2024 10  6 - 20 mg/dL Final   Creatinine, Ser 11/07/2024 0.89  0.61 - 1.24 mg/dL Final   Calcium 87/69/7974 9.2  8.9 - 10.3 mg/dL Final   Total Protein 87/69/7974 7.3  6.5 - 8.1 g/dL Final   Albumin 87/69/7974 4.9   3.5 - 5.0 g/dL Final   AST 87/69/7974 20  15 - 41 U/L Final   ALT 11/07/2024 14  0 - 44 U/L Final   Alkaline Phosphatase 11/07/2024 73  38 - 126 U/L Final   Total Bilirubin 11/07/2024 0.5  0.0 - 1.2 mg/dL Final   GFR, Estimated 11/07/2024 >60  >60 mL/min Final   Comment: (NOTE) Calculated using the CKD-EPI Creatinine Equation (2021)    Anion gap 11/07/2024 16 (H)  5 - 15 Final   Performed at [facility], [address] [place], [place] [address]   Alcohol, Ethyl (B) 11/07/2024 324 (HH)  <15 mg/dL Final   Comment: Critical Value, Read Back and verified with LISA THOMPSON 11/07/24 2052 MU (NOTE) For medical purposes only. Performed at [facility], [address] [place], [place] [address]    WBC 11/07/2024 6.5  4.0 - 10.5 K/uL Final   RBC 11/07/2024 5.12  4.22 - 5.81 MIL/uL Final   Hemoglobin 11/07/2024 16.1  13.0 - 17.0 g/dL Final   HCT 87/69/7974 46.3  39.0 - 52.0 % Final   MCV 11/07/2024 90.4  80.0 - 100.0 fL Final   MCH 11/07/2024 31.4  26.0 - 34.0 pg Final   MCHC 11/07/2024 34.8  30.0 - 36.0 g/dL Final   RDW 87/69/7974 11.8  11.5 - 15.5 % Final   Platelets 11/07/2024 237  150 - 400 K/uL Final   nRBC 11/07/2024 0.0  0.0 - 0.2 % Final   Performed at [facility], [address] [place], [place] [address]   Opiates 11/07/2024 NEGATIVE  NEGATIVE Final   Cocaine 11/07/2024 NEGATIVE  NEGATIVE Final   Benzodiazepines 11/07/2024 NEGATIVE  NEGATIVE Final   Amphetamines 11/07/2024 NEGATIVE  NEGATIVE Final   Tetrahydrocannabinol 11/07/2024 NEGATIVE  NEGATIVE Final   Barbiturates 11/07/2024 NEGATIVE  NEGATIVE Final   Methadone Scn, Ur 11/07/2024 NEGATIVE  NEGATIVE Final   Fentanyl 11/07/2024 NEGATIVE  NEGATIVE Final   Comment: (NOTE) Drug screen is for Medical Purposes only. Positive results are preliminary only. If confirmation is needed, notify lab within 5 days.  Drug Class                 Cutoff (ng/mL) Amphetamine and metabolites  1000 Barbiturate and metabolites  200 Benzodiazepine              200 Opiates and metabolites     300 Cocaine and metabolites     300 THC                         50 Fentanyl                    5 Methadone                   300  Trazodone is metabolized in vivo to several metabolites,  including pharmacologically active m-CPP, which is excreted in the  urine.  Immunoassay screens for amphetamines and MDMA have potential  cross-reactivity with these compounds and may provide false positive  result.  Performed at [facility], [address] [place], [place] [address]    Salicylate Lvl 11/07/2024 <7.0 (L)  7.0 - 30.0 mg/dL Final   Performed at [facility], [address] [place], [place] [address]   Acetaminophen  (Tylenol ), Serum 11/07/2024 <10 (L)  10 - 30 ug/mL Final   Comment: (NOTE) Toxic concentrations can be more effectively related to post dose interval; >200, >100, and >50 ug/mL serum concentrations correspond to toxic concentrations at 4, 8, and 12 hours post dose, respectively.  Performed at [facility], [address] [place], [place] [address]     PSYCHIATRIC REVIEW OF SYSTEMS (ROS)  - No current suicidal or homicidal ideation or psychotic symptoms reported. - Recent increase in anxiety and poor sleep noted. - Appetite described as variable, with occasional snacking. - History of alcohol withdrawal seizures approximately one year ago.  Additional findings:      Musculoskeletal: No abnormal movements observed      Gait & Station: Laying/Sitting      Pain Screening: Denies      Nutrition & Dental Concerns: Decrease in food intake and/or loss of appetite  RISK FORMULATION/ASSESSMENT  Is the patient experiencing any suicidal or homicidal ideations: No       Explain if yes:  Protective factors considered for safety management: include employment, engagement in [place], support from best friend and mother, and access to medical care.  Patient denied active or passive suicidal ideation and denied any history of suicide attempts or plans.  Risk factors/concerns considered for safety management:  include male gender, recent relapse after four months of sobriety, history of alcohol use disorder since age 46, prior alcohol withdrawal seizures, and family history of alcohol use. Modifiable risk factors include current stressors, access to alcohol, and lack of effective coping strategies for anxiety.   Is there a safety management plan with the patient and treatment team to minimize risk factors and promote protective factors: Yes           Explain: inpatient admission for detox Is crisis care placement or psychiatric hospitalization recommended: Yes     Based on my current evaluation and risk assessment, patient is determined at this time to be at:  Moderate Risk  *RISK ASSESSMENT Risk assessment is a dynamic process; it is possible that this patient's condition, and risk level, may change. This should be re-evaluated and managed over time as appropriate. Please re-consult psychiatric consult services if additional assistance is needed in terms of risk assessment and management. If your team decides to discharge this patient, please advise the patient how to best access emergency psychiatric services, or to call 911, if their condition  worsens or they feel unsafe in any way.   Otilia Kareem Velna, NP Telepsychiatry Consult Services    [1] No Known Allergies [2]  Social History Tobacco Use  Smoking Status Every Day   Types: Cigarettes  Smokeless Tobacco Never

## 2024-11-08 NOTE — ED Notes (Signed)
 Pt oriented to unit, further questions denied. Pt took a shower, currently watching TV in dayroom. RN reviewed current medication orders. Pt denies si hi and avh, verbal contract for safety provided. Pt denies concerns or complaints at this time.

## 2024-11-08 NOTE — ED Notes (Signed)
Pt given hospital provided meal.  

## 2024-11-08 NOTE — ED Provider Notes (Signed)
 Patient is alert oriented pleasant no distress.  He is understanding the plan to transfer to [facility].  He does understand as well that he is under involuntary commitment.   He is fully alert has eaten ambulates without difficulty.  Appears appropriate for transfer   Dicky Anes, MD 11/08/24 1725

## 2024-11-08 NOTE — ED Notes (Signed)
 Pt given the phone to make call he was made aware he had 10 minutes and this pt would be back to get the phone

## 2024-11-08 NOTE — ED Notes (Signed)
EMTALA Reviewed by charge RN 

## 2024-11-08 NOTE — ED Notes (Addendum)
 Patient is in the dayroom A/OX4, Denies SI/HI/AVH and denies needing anything and NAD.  Environment secured per policy. Respirations even and unlabored. Will monitor for safety.

## 2024-11-08 NOTE — ED Notes (Addendum)
 Given Orange juice, tolerating po well. Denies any complaints at this time. Alert and oriented x3 in no apparent distress. Good general impression.

## 2024-11-09 MED ORDER — CLONAZEPAM 1 MG PO TABS
2.0000 mg | ORAL_TABLET | Freq: Once | ORAL | Status: AC
  Administered 2024-11-09: 2 mg via ORAL
  Filled 2024-11-09: qty 2

## 2024-11-09 NOTE — ED Notes (Signed)
 This writer attempted to wake PT up for dinner. PT expressed that he is not that hungry. This clinical research associate informed PT to come up to the dining room if that changes.

## 2024-11-09 NOTE — ED Notes (Signed)
 Pt in room meal offered. No acute distress noted. No concerns voiced. Informed pt to notify staff with any needs or assistance. Pt verbalized understanding and agreement. Will continue to monitor for safety.

## 2024-11-09 NOTE — Group Note (Signed)
 Group Topic: Recovery Basics  Group Date: 11/09/2024 Start Time: 1000 End Time: 1050 Facilitators: Gerome Jolly, NT  Department: [facility]  Number of Participants: 5  Group Focus: substance abuse education Treatment Modality:  Cognitive Behavioral Therapy, Psychoeducation, Solution-Focused Therapy, and Spiritual Interventions utilized were exploration and story telling Purpose: explore maladaptive thinking, increase insight, and  Facilitator shared his recovery journey, identifying the pitfalls along the way. This was done to illustrate that recovery is possible. The facilitator engaged the group in writing a gratitude list and sharing that list with group if they felt comfortable  Name: Robert Briggs Date of Birth: 08-11-1996  MR: [id number]    Level of Participation: minimal Quality of Participation: attentive, cooperative, and engaged Interactions with others: gave feedback Mood/Affect: appropriate Triggers (if applicable): NA Cognition: not focused Progress: Minimal Response: Patient listened as facilitator shared his story. Patient participated in writing and sharing his gratitude list  Plan: follow-up needed  Patients Problems:  Patient Active Problem List   Diagnosis Date Noted   Anxiety disorder 11/08/2024   Alcohol use disorder 11/08/2024   Generalized anxiety disorder 04/15/2021   Panic attacks 04/15/2021   Alcohol abuse 04/15/2021

## 2024-11-09 NOTE — ED Notes (Signed)
 Patient alert and oriented x4. He denies SI/HI and denies AH/VH. Patient calm and cooperative with staff. He denies pain 0/10 and has been isolated to room.

## 2024-11-09 NOTE — ED Notes (Signed)
 Pt. Received awake alert answers simple questions asked appropriately, occassional eye contact. Denies SI, safety maintained.

## 2024-11-09 NOTE — ED Provider Notes (Signed)
 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 male with a history of alcohol use d/o who was transferred under IVC status from the [facility] ER, & admitted to the [facility] on 11/07/2024. Pt initially presented to the [place] ER on 12/30 after his friend [name]) petitioned him for involuntary commitment citing suicidal statements in the context of excessive alcohol consumption. Per IVC documentation, friend stated that pt stated he was going to end up killing himself, and that he had been drinking excessively for 3-4 years.  Assessment and review of symptoms: During encounter with patient, he exhibits poor insight into the severity of his alcohol consumption, asking to leave, reports not being ready to stop using alcohol states that he uses alcohol in an effort to self-medicate his anxiety.  Reports being diagnosed with GAD and MDD in the past, shares that he has never found medication that has actually helped him with anxiety.  Reports trials of Klonopin  in the past, Ativan , Prozac , gabapentin , shares that none of these medications were helpful.  He describes symptoms significant for GAD including restlessness, attention, feeling on edge most of the time, with difficulty concentrating, reports that symptoms have been ongoing all of my life, worsening in the past few months.  Patient relays that he was residing at an [place] house for 4.5 months during which he was able to stay sober, and sustain a job, but left the sober House last Sunday, 12/28, and went to reside at a motel where he has been using alcohol until the police took went and got him, and took him to the [facility] ER.    Pt  denies that he was suicidal, denies that he made suicidal statements, but agrees that in the context of intoxication, he might have made some statements which he does not recall making. He shares that when the police came to the motel to get him to take him to the ER, he was sleeping. Pt persistently asking during this  encounter if he can leave. He shares that he is not ready to stop consuming alcohol. Seems to be in the pre contemplative stage of change. Insight is poor, judgment is poor, conservation officer, historic buildings repeatedly explained the negative impacts of his alcohol use to his mental health, as well as the potential dangers of his alcohol consumption to his life as well as to the wellbeing of others. Pt was able to agree to this; agreed that currently, alcohol is in control of his life, and that he is no longer in control. Writer spent an extensive amount of time providing pt with positive reinforcements to consider going to substance abuse treatment longer term.  Pt shared that he has been to rehab in the past and that those were not helpful. He reported that he has been to places such as [facility], [address], and that those were not helpful/, and he is no longer interested. Writer educated pt that at this time, his involuntary commitment was being upheld, as his insight has been determined to be poor, and there is an imminent risk of danger to himself, and potentially someone else, if he does not get the treatment that he needs.  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 In review of records, pt made suicidal statements which let to his  friend petitioning for involuntary commitment leading to this encounter. Other records in chart review suggest that pt gets impaired and exhibits behaviors which are erratic, & dangerous in nature, thereby rendering him a danger to self and others when  intoxicated. This risk further elevates since he has access to guns. This justifies his involuntary commitment being upheld to foster him to comply with treatment since his insight and judgment at this are too poor to make that decision. He himself is verbalizing his unwillingness to stop drinking alcohol and tells clinical research associate and that he would like to be discharged so that he can go back to the motel and resume drinking alcohol.  Pt also requires treatment for depressive symptoms which he  admits to having; He reports insomnia, anhedonia, aw persistently decreased energy level, & poor concentration levels which he has had for several months. Denies other symptoms significant for depression.   Pt denies other symptoms significant for other mental health problems. Denies symptoms significant for bipolar 1/2, denies psychosis; specifically denies AVH, denies paranoia or delusional thinking in the past, recent pr presently. There are no overt signs of psychosis. Denies a history of self injurious behaviors. Denies any head injurious. Denies a history of seizures related to alcohol withdrawals. Shares that he has been hospitalized for his inability to quit alcohol use on his own though.  Plan: Continue continue [facility] admission, and continue to encourage patient to coordinate with CSW to be referred into longer-term rehabilitation for substance abuse. See Detailed treatment plan below.  PHQ 2-9:  Flowsheet Row ED from 11/08/2024 in [facility]  Thoughts that you would be better off dead, or of hurting yourself in some way Several days  PHQ-9 Total Score 9    Flowsheet Row ED from 11/08/2024 in [facility] ED from 11/07/2024 in [facility] Emergency Department at [facility] ED from 11/15/2023 in [facility] Emergency Department at [facility]  C-SSRS RISK CATEGORY No Risk No Risk No Risk    Screenings    Flowsheet Row Most Recent Value   CIWA-Ar Total 1   Total Time spent with patient: 1.5 hours  Musculoskeletal  Strength & Muscle Tone: within normal limits Gait & Station: normal Patient leans: N/A  Psychiatric Specialty Exam  Presentation General Appearance: Appropriate for Environment  Eye Contact:Fair  Speech:Clear and Coherent  Speech Volume:Decreased  Handedness:Right   Mood and Affect  Mood:Depressed; Anxious  Affect:Congruent   Thought Process  Thought Processes:Coherent  Descriptions of Associations:Intact  Orientation:Full (Time, Place and Person)  Thought Content:Logical  Diagnosis of Schizophrenia or Schizoaffective disorder in past: No data recorded  Hallucinations:Hallucinations: None  Ideas of Reference:None  Suicidal Thoughts:Suicidal Thoughts: No  Homicidal Thoughts:Homicidal Thoughts: No   Sensorium  Memory:Immediate Poor  Judgment:Poor  Insight:Poor   Executive Functions  Concentration:Poor  Attention Span:Fair  Recall:Fair  Fund of Knowledge:Poor  Language:Fair  Psychomotor Activity  Psychomotor Activity:Psychomotor Activity: Decreased  Assets  Assets:Resilience  Sleep  Sleep:Sleep: Poor  Nutritional Assessment (For OBS and [facility] admissions only) Has the patient had a weight loss or gain of 10 pounds or more in the last 3 months?: No Has the patient had a decrease in food intake/or appetite?: No Does the patient have dental problems?: No Does the patient  have eating habits or behaviors that may be indicators of an eating disorder including binging or inducing vomiting?: No Has the patient recently lost weight without trying?: 0   Physical Exam Vitals and nursing note reviewed.    Review of Systems  Psychiatric/Behavioral:  Positive for depression and substance abuse. Negative for hallucinations, memory loss and suicidal ideas. The patient is nervous/anxious and has insomnia.   All other systems reviewed and are negative.   Blood pressure  118/80, pulse 95, temperature 98.2 F (36.8 C), temperature source Oral, resp. rate 17, SpO2 96%. There is no height or weight on file to calculate BMI.  Past Psychiatric History: MDD, GAD, alcohol use d/o   Is the patient at risk to self? Yes  Has the patient been a risk to self in the past 6 months? Yes .    Has the patient been a risk to self within the distant past? Yes   Is the patient a risk to others? Yes   Has the patient been a risk to others in the past 6 months? Yes   Has the patient been a risk to others within the distant past? Yes   Past Medical History:  Past Medical History:  Diagnosis Date   Alcohol abuse     Family History: No family history on file.  Social History:  Social History   Socioeconomic History   Marital status: Single    Spouse name: Not on file   Number of children: Not on file   Years of education: Not on file   Highest education level: Not on file  Occupational History   Not on file  Tobacco Use   Smoking status: Every Day    Types: Cigarettes   Smokeless tobacco: Never  Vaping Use   Vaping status: Never Used  Substance and Sexual Activity   Alcohol use: Yes    Comment: daily   Drug use: Not Currently   Sexual activity: Not on file  Other Topics Concern   Not on file  Social History Narrative   Not on file   Social Drivers of Health   Tobacco Use: High Risk (11/07/2024)   Patient History    Smoking Tobacco Use: Every Day    Smokeless Tobacco Use: Never    Passive Exposure: Not on file  Financial Resource Strain: Not on file  Food Insecurity: No Food Insecurity (11/08/2024)   Epic    Worried About Programme Researcher, Broadcasting/film/video in the Last Year: Never true    Ran Out of Food in the Last Year: Never true  Transportation Needs: No Transportation Needs (11/08/2024)   Epic    Lack of Transportation (Medical): No    Lack of Transportation (Non-Medical): No  Physical Activity: Not on file  Stress: Not on file  Social Connections: Not on  file  Intimate Partner Violence: Not At Risk (11/08/2024)   Epic    Fear of Current or Ex-Partner: No    Emotionally Abused: No    Physically Abused: No    Sexually Abused: No  Depression (PHQ2-9): Medium Risk (11/09/2024)   Depression (PHQ2-9)    PHQ-2 Score: 9  Alcohol Screen: Not on file  Housing: Not on file  Utilities: Not At Risk (11/08/2024)   Epic    Threatened with loss of utilities: No  Health Literacy: Not on file     Last Labs:  Admission on 11/07/2024, Discharged on 11/08/2024  Component Date Value Ref Range Status   Sodium 11/07/2024 145  135 - 145 mmol/L Final   Potassium 11/07/2024 3.9  3.5 - 5.1 mmol/L Final   Chloride 11/07/2024 104  98 - 111 mmol/L Final   CO2 11/07/2024 26  22 - 32 mmol/L Final   Glucose, Bld 11/07/2024 112 (H)  70 - 99 mg/dL Final   Glucose reference range applies only to samples taken after fasting for at least 8 hours.   BUN 11/07/2024 10  6 - 20 mg/dL Final   Creatinine, Ser 11/07/2024 0.89  0.61 - 1.24 mg/dL Final   Calcium 87/69/7974 9.2  8.9 - 10.3 mg/dL Final   Total Protein 87/69/7974 7.3  6.5 - 8.1 g/dL Final   Albumin 87/69/7974 4.9  3.5 - 5.0 g/dL Final   AST 87/69/7974 20  15 - 41 U/L Final   ALT 11/07/2024 14  0 - 44 U/L Final   Alkaline Phosphatase 11/07/2024 73  38 - 126 U/L Final   Total Bilirubin 11/07/2024 0.5  0.0 - 1.2 mg/dL Final   GFR, Estimated 11/07/2024 >60  >60 mL/min Final   Comment: (NOTE) Calculated using the CKD-EPI Creatinine Equation (2021)    Anion gap 11/07/2024 16 (H)  5 - 15 Final   Performed at [facility], [address] [place], [place] [address]   Alcohol, Ethyl (B) 11/07/2024 324 (HH)  <15 mg/dL Final   Comment: Critical Value, Read Back and verified with LISA THOMPSON 11/07/24 2052 MU (NOTE) For medical purposes only. Performed at [facility], [address] [place], [place] [address]    WBC 11/07/2024 6.5  4.0 - 10.5 K/uL Final   RBC 11/07/2024 5.12  4.22 - 5.81  MIL/uL Final   Hemoglobin 11/07/2024 16.1  13.0 - 17.0 g/dL Final   HCT 87/69/7974 46.3  39.0 - 52.0 % Final   MCV 11/07/2024 90.4  80.0 - 100.0 fL Final   MCH 11/07/2024 31.4  26.0 - 34.0 pg Final   MCHC 11/07/2024 34.8  30.0 - 36.0 g/dL Final   RDW 87/69/7974 11.8  11.5 - 15.5 % Final   Platelets 11/07/2024 237  150 - 400 K/uL Final   nRBC 11/07/2024 0.0  0.0 - 0.2 % Final   Performed at [facility], [address] [place], [place] [address]   Opiates 11/07/2024 NEGATIVE  NEGATIVE Final   Cocaine 11/07/2024 NEGATIVE  NEGATIVE Final   Benzodiazepines 11/07/2024 NEGATIVE  NEGATIVE Final   Amphetamines 11/07/2024 NEGATIVE  NEGATIVE Final   Tetrahydrocannabinol 11/07/2024 NEGATIVE  NEGATIVE Final   Barbiturates 11/07/2024 NEGATIVE  NEGATIVE Final   Methadone Scn, Ur 11/07/2024 NEGATIVE  NEGATIVE Final   Fentanyl 11/07/2024 NEGATIVE  NEGATIVE Final   Comment: (NOTE) Drug screen is for Medical Purposes only. Positive results are preliminary only. If confirmation is needed, notify lab within 5 days.  Drug Class                 Cutoff (ng/mL) Amphetamine and metabolites 1000 Barbiturate and metabolites 200 Benzodiazepine              200 Opiates and metabolites     300 Cocaine and metabolites     300 THC                         50 Fentanyl                    5 Methadone  300  Trazodone is metabolized in vivo to several metabolites,  including pharmacologically active m-CPP, which is excreted in the  urine.  Immunoassay screens for amphetamines and MDMA have potential  cross-reactivity with these compounds and may provide false positive  result.  Performed at [facility], [address] [place], [place] [address]    Salicylate Lvl 11/07/2024 <7.0 (L)  7.0 - 30.0 mg/dL Final   Performed at [facility], [address] [place], [place] [address]   Acetaminophen  (Tylenol ), Serum 11/07/2024 <10 (L)  10 - 30 ug/mL Final   Comment:  (NOTE) Toxic concentrations can be more effectively related to post dose interval; >200, >100, and >50 ug/mL serum concentrations correspond to toxic concentrations at 4, 8, and 12 hours post dose, respectively.  Performed at [facility], [address] [place], [place] [address]    Alcohol, Ethyl (B) 11/08/2024 <15  <15 mg/dL Final   Comment: (NOTE) For medical purposes only. Performed at [facility], [address] [place], [place] [address]    Vitamin B-12 11/08/2024 399  180 - 914 pg/mL Final   Performed at [facility], [address], [place], [place] [address]   Hgb A1c MFr Bld 11/08/2024 5.0  4.8 - 5.6 % Final   Comment: (NOTE) Diagnosis of Diabetes The following HbA1c ranges recommended by the American Diabetes Association (ADA) may be used as an aid in the diagnosis of diabetes mellitus.  Hemoglobin             Suggested A1C NGSP%              Diagnosis  <5.7                   Non Diabetic  5.7-6.4                Pre-Diabetic  >6.4                   Diabetic  <7.0                   Glycemic control for                       adults with diabetes.     Mean Plasma Glucose 11/08/2024 96.8  mg/dL Final   Performed at [facility], [address], [place], [place] [address]   Cholesterol 11/08/2024 161  0 - 200 mg/dL Final   Comment:        ATP III CLASSIFICATION:  <200     mg/dL   Desirable  799-760  mg/dL   Borderline High  >=759    mg/dL   High           Triglycerides 11/08/2024 71  <150 mg/dL Final   HDL 87/68/7974 79  >40 mg/dL Final   Total CHOL/HDL Ratio 11/08/2024 2.1  RATIO Final   VLDL 11/08/2024 14  0 - 40 mg/dL Final   LDL Cholesterol 11/08/2024 68  0 - 99 mg/dL Final   Comment:        Total Cholesterol/HDL:CHD Risk Coronary Heart Disease Risk Table                     Men   Women  1/2 Average Risk   3.4   3.3  Average Risk       5.0   4.4  2 X Average Risk  9.6   7.1  3 X Average Risk  23.4   11.0         Use the calculated Patient Ratio above and the CHD Risk Table to determine the patient's CHD Risk.        ATP III CLASSIFICATION (LDL):  <100     mg/dL   Optimal  899-870  mg/dL   Near or Above                    Optimal  130-159  mg/dL   Borderline  839-810  mg/dL   High  >809     mg/dL   Very High Performed at [facility], [address] [place], [place] [address]    TSH 11/08/2024 0.682  0.350 - 4.500 uIU/mL Final   Performed at [facility], [address] [place], [place] [address]   Magnesium 11/08/2024 2.0  1.7 - 2.4 mg/dL Final   Performed at [facility], [address] [place], [place] [address]    Allergies: Patient has no known allergies.  Medications:  Facility Ordered Medications  Medication   hydrOXYzine  (ATARAX ) tablet 25 mg   thiamine  (VITAMIN B1) tablet 100 mg   traZODone (DESYREL) tablet 50 mg   acetaminophen  (TYLENOL ) tablet 650 mg   alum & mag hydroxide-simeth (MAALOX/MYLANTA) 200-200-20 MG/5ML suspension 30 mL   magnesium hydroxide (MILK OF MAGNESIA) suspension 30 mL   haloperidol (HALDOL) tablet 5 mg   And   diphenhydrAMINE (BENADRYL) capsule 50 mg   haloperidol lactate (HALDOL) injection 5 mg   And   diphenhydrAMINE (BENADRYL) injection 50 mg   And   LORazepam  (ATIVAN ) injection 2 mg   haloperidol lactate (HALDOL) injection 10 mg   And   diphenhydrAMINE (BENADRYL) injection 50 mg   And   LORazepam  (ATIVAN ) injection 2 mg   LORazepam  (ATIVAN ) tablet 1 mg   [COMPLETED] clonazePAM  (KLONOPIN ) tablet 2 mg   PTA Medications  Medication Sig   clonazePAM  (KLONOPIN ) 0.5 MG tablet Take 0.5 mg by mouth 2 (two) times daily.    Long Term Goals: Improvement in symptoms so as ready for discharge  Short Term Goals: Patient will verbalize feelings in meetings with treatment team members., Patient will attend at least of 50% of the groups daily., Pt will complete the PHQ9 on admission, day 3 and discharge., Patient will  participate in completing the [place] Suicide Severity Rating Scale, Patient will score a low risk of violence for 24 hours prior to discharge, and Patient will take medications as prescribed daily.  Medical Decision Making  Treatment Plan Summary: Daily contact with patient to assess and evaluate symptoms and progress in treatment and Medication management  Safety and Monitoring: Voluntary admission to inpatient psychiatric unit for safety, stabilization and treatment Daily contact with patient to assess and evaluate symptoms and progress in treatment Patient's case to be discussed in multi-disciplinary team meeting Observation Level : q15 minute checks Vital signs: q12 hours Precautions: Safety  Principal Problem:   Alcohol use disorder  Medications: -Given Klonopin  2 mg x 1 dose, and will reassess in 3 hrs  -Continue Trazodone 50 mg nightly for sleep  PRNS -Continue Tylenol  650 mg every 6 hours PRN for mild pain -Continue Maalox 30 mg every 4 hrs PRN for indigestion -Continue Milk of Magnesia as needed every 6 hrs for constipation  Labs Reviewed: Order Vit D & folate levels. EKG with Qtc 420  Discharge Planning: Social work and case management to  assist with discharge planning and identification of hospital follow-up needs prior to discharge Estimated LOS: 5-7 days Discharge Concerns: Need to establish a safety plan; Medication compliance and effectiveness Discharge Goals: Return home with outpatient referrals for mental health follow-up including medication management/psychotherapy  I certify that inpatient services furnished can reasonably be expected to improve the patient's condition.    Donia Snell, NP 1/1/20261:49 PM   Donia Snell, NP 11/09/2024  1:49 PM

## 2024-11-09 NOTE — Care Management (Signed)
 [facility] Care Management...  Writer met with patient to discuss discharge planning...  Patient reported not being sure why he was IVC'd  Patient reported that he is not looking for inpatient or out patient treatment.  Patient asked about his IVC status.   Writer informed patient that, per provider he will serve out the complete IVC of 7 days.  Writer discussed with patient his excessive alcohol consumption and the affects he could be causing his body/organs. Writer discussed with patient the possibility of considering harm reduction or a IOP.  Writer faxed referral to [facility]

## 2024-11-09 NOTE — Discharge Instructions (Addendum)
 [facility] Care Management...   Patient declined residential treatment  Patient can follow up with referral sent on 11/09/2024 to...   Addiction Recovery [facility] [address] [place], [place] [address] Phone: [phone]  Community Resource Guide Outpatient Counseling/Substance Abuse Adult [employer] 211 is a great source of information about community services available.  Access by dialing 2-1-1 from anywhere in [place] , or by website -  pooledincome.pl.   Other Local Resources (Updated 09/2016)  Crisis Hotlines   Services     Area Served  [employer] Crisis Hotline, available 24 hours a day, 7 days a week: (204)800-3727 [facility], [place]  [facility] Crisis Hotline, available 24 hours a day, 7 days a week: 548-508-7728 [facility], [place]  [facility] Suicide Prevention Hotline, available 24 hours a day, 7 days a week: 606-113-8589 [facility] [place], [place]  [employer], available 24 hours a day, 7 days a week: 725-114-2596 or 249-666-8559 [facility], [place]   [facility] Access to [employer], available 24 hours a day, 7 days a week: [phone] All   [facility] Crisis Hotline, available 24 hours a day, 7 days a week: 364-142-1218 All   Other Local Resources (Updated 09/2016)  Outpatient Counseling/ Substance Abuse Programs  Services     Address and Phone Number  ADS ([facility]cohol [facility] Drug Services)  Options include Individual counseling, group counseling, intensive outpatient program (several hours a day, several days a week) Offers depression assessments Provides methadone maintenance program [phone] [address] [place], [place] [address]   [facility]  Offers partial hospitalization/day treatment and DUI/DWI programs [employer], private insurance [phone] [address], [address] [place], [place] [address]  [facility] [place], [place], private insurance [phone] [address] [place], [place]  [facility]   Services include intensive outpatient program (several hours a day, several days a week), outpatient treatment, DUI/DWI services, family education Also has some services specifically for [employer] transitional housing  [phone] [address] [place], [place] [address]     [facility] Accepts Medicare, private pay, and private insurance [phone] [address], [address] [place], [place] [address]  [employer] of Care Services include individual counseling, substance abuse intensive outpatient program (several hours a day, several days a week), day treatment Florinda Many, Medicaid, private insurance [phone] [address], [address] [place], [place] [address]  [facility]  Offers substance abuse intensive outpatient program (several hours a day, several days a week), partial hospitalization program [phone] [address] [place], [place] [address]  [phone] [address] [place], [place] [address]  [phone] [address] [place], [place] [address]  [phone], [address] [place], [place] [address]  [facility] Individual counseling only Accepts private insurance only [phone] [address] [place], [place] [address]  [facility] Methadone maintenance program [phone] [address] [place], [place] [address]  [facility] providing substance abuse and mental health counseling Accepts Medicaid, Medicare, private insurance Offers sliding scale for uninsured [phone] [address] [place], [place]   [facility] [employer], [employer]. Offers individual counseling, and intensive in-home services [phone] [address] [place], [place] [address]  [facility] individual counseling, family counseling, group therapy, domestic violence counseling, [employer], Medicaid, private insurance Offers sliding scale  for uninsured [phone] [address] [place], [place] [address]  [phone] [facility], [address] [place], [place] [address]  [facility] Offers individual, family and group counseling 3 locations - [place], [place], and [place]  [phone]  [address]  [place], [place] [address]  [address] [place], [place] [address]  [address] [place], [place] [address]  [facility]   Offers psychiatric assessment, 8-week Intensive Outpatient Program (several hours a day, several times a week, daytime or evenings), early recovery group, family Program, medication management Private pay or private insurance only [phone], or  [phone] [address] [place], [place] [address]  [facility] Offers individual, couples and family counseling Accepts Medicaid, private insurance, and sliding scale for uninsured [phone] [address] [place], [place] [address]  Alm Riding, MD Individual counseling Private insurance [phone] [address] [place], [place] [address]  [facility]  Offers assessment, substance abuse treatment, and behavioral health treatment [phone] [address] [place], [place] [address]  [facility] Individual counseling Accepts private insurance [phone] [address] [place], [place] [address]  [facility] Individual counseling Florinda Many, private insurance [phone] [address] [place], [place] [address]  [facility]   Serves children and adolescents ages 42 to 41 years of age Offers 90-day or longer intensive outpatient programs (3 hours a day, 3 days a  week) Cost is $5000/month. Facility is out of network with all insurance companies. Accepts private pay. Does not accept Medicaid.   Offers scholarships and financial assistance [phone] [facility] [place], [place]  [facility] Individual counseling Medicare, private insurance [phone] [address] [place], [place] [address]  [facility]   Offers intensive outpatient program (several hours a day, several times a week) and partial hospitalization program [phone] [address] [place], [place] [address]  Elodie Anon, MD Individual counseling [phone] [address] [place], [place] [address]  [facility] Offers Christian counseling to individuals, couples, and families Accepts Medicare and private insurance; offers sliding scale for uninsured [phone] [address] [place], [place] [address]  [facility] counseling [phone] [address] [place], [place] [address]  [facility] [employer] crisis counseling, individual counseling, group therapy, in-home therapy, domestic violence services, day treatment, DWI services, Administrator, Arts (CST), Doctor, Hospital (ACTT), substance abuse Intensive Outpatient Program (several hours a day, several times a week) 2 locations - [place] and [place] [phone] [address] [place], [place] [address]  [phone] [address] [place]  [address] [place], [place] [address]  [facility]    Individual counseling and group therapy Accepts private insurance, [place], [place] [phone] [address] [place], [place]  [facility] Offers individual and family counseling Offers LGBTQ services Accepts private insurance and private pay [phone] [address] [place], [place] [address]  [facility] Full  service practice for adult and pediatric patients Monday-Friday; 8:00a - 5:00p [phone] (Multiple locations)  [facility] [employer] individual counseling, group therapy, and outpatient detox Accepts private insurance [phone] [address] [place], [place]  [facility] Individual counseling Accepts Medicare, private insurance [phone] [address] [place], [place] [address]  [employer] Individual counseling [employer], private insurance [phone] [address] [place], [place] [address]  [facility]  Offers substance abuse Intensive Outpatient Program (several  hours a day, several times a week) [phone], or [phone] [place], [place]

## 2024-11-09 NOTE — Group Note (Signed)
 Group Topic: Social Support  Group Date: 11/09/2024 Start Time: 0800 End Time: 0845 Facilitators: Nena Lesley PARAS, NT  Department: [facility]  Number of Participants: 5  Group Focus: check in and community group Treatment Modality:  Psychoeducation Interventions utilized were exploration and support Purpose: explore maladaptive thinking, express feelings, and increase insight  Name: Robert Briggs Date of Birth: 1996/06/28  MR: [id number]    Level of Participation: active Quality of Participation: engaged Interactions with others: gave feedback Mood/Affect: appropriate Triggers (if applicable): N/A Cognition: coherent/clear Progress: Gaining insight Response: The patient did not have any concerns. His behavior and communication was appropriate and pleasant throughout the group. Plan: patient will be encouraged to continue coming to groups throughout the duration of his treatment and inform staff if any concerns arise   Patients Problems:  Patient Active Problem List   Diagnosis Date Noted   Anxiety disorder 11/08/2024   Alcohol use disorder 11/08/2024   Generalized anxiety disorder 04/15/2021   Panic attacks 04/15/2021   Alcohol abuse 04/15/2021

## 2024-11-09 NOTE — ED Notes (Signed)
"  Patient asleep at this time   "

## 2024-11-09 NOTE — Care Plan (Signed)
 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)     Current Medications:  Current Facility-Administered Medications  Medication Dose Route Frequency Provider Last Rate Last Admin   acetaminophen  (TYLENOL ) tablet 650 mg  650 mg Oral Q6H PRN Smith, Annie B, NP       alum & mag hydroxide-simeth (MAALOX/MYLANTA) 200-200-20 MG/5ML suspension 30 mL  30 mL Oral Q4H PRN Smith, Annie B, NP       haloperidol (HALDOL) tablet 5 mg  5 mg Oral TID PRN Smith, Annie B, NP       And   diphenhydrAMINE (BENADRYL) capsule 50 mg  50 mg Oral TID PRN Smith, Annie B, NP       haloperidol lactate (HALDOL) injection 5 mg  5 mg Intramuscular TID PRN Smith, Annie B, NP       And   diphenhydrAMINE (BENADRYL) injection 50 mg  50 mg Intramuscular TID PRN Smith, Annie B, NP       And   LORazepam  (ATIVAN ) injection 2 mg  2 mg Intramuscular TID PRN Smith, Annie B, NP       haloperidol lactate (HALDOL) injection 10 mg  10 mg Intramuscular TID PRN Smith, Annie B, NP       And   diphenhydrAMINE (BENADRYL) injection 50 mg  50 mg Intramuscular TID PRN Smith, Annie B, NP       And   LORazepam  (ATIVAN ) injection 2 mg  2 mg Intramuscular TID PRN Smith, Annie B, NP       hydrOXYzine  (ATARAX ) tablet 25 mg  25 mg Oral TID PRN Smith, Annie B, NP       LORazepam  (ATIVAN ) tablet 1 mg  1 mg Oral Q6H PRN Randall, Veronique M, NP       magnesium hydroxide (MILK OF MAGNESIA) suspension 30 mL  30 mL Oral Daily PRN Smith, Annie B, NP       thiamine  (VITAMIN B1) tablet 100 mg  100 mg Oral Daily Smith, Annie B, NP   100 mg at 11/09/24 9072   traZODone (DESYREL) tablet 50 mg  50 mg Oral QHS Smith, Annie B, NP   50 mg at 11/08/24 2133   Current Outpatient Medications  Medication Sig Dispense Refill   clonazePAM  (KLONOPIN ) 0.5 MG tablet  Take 0.5 mg by mouth 2 (two) times daily.     PTA Medications: Prior to Admission medications  Medication Sig Start Date End Date Taking? Authorizing Provider  clonazePAM  (KLONOPIN ) 0.5 MG tablet Take 0.5 mg by mouth 2 (two) times daily. 07/26/23   [provider]    Patient Stressors: Substance abuse    Patient Strengths: Ability for insight  Capable of independent living  Communication skills   Treatment Modalities: Medication Management, Group therapy, Case management,  1 to 1 session with clinician, Psychoeducation, Recreational therapy.   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 Patient will attend at least of 50% of the groups daily. Pt will complete the PHQ9 on admission, day 3 and discharge. Patient will participate in completing the [place] Suicide Severity Rating Scale Patient  will score a low risk of violence for 24 hours prior to discharge Patient will take medications as prescribed daily.  Medication Management: Evaluate patient's response, side effects, and tolerance of medication regimen.  Therapeutic Interventions: 1 to 1 sessions, Unit Group sessions and Medication administration.  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  Short Term Goals: Increase skills for wellness and recovery by attending 50% of scheduled groups.  Therapeutic Interventions: Assess for all discharge needs, 1 to 1 time with Child psychotherapist, Explore  available resources and support systems, Assess for adequacy in community support network, Educate family and significant other(s) on suicide prevention, Complete Psychosocial Assessment, Interpersonal group therapy.  Evaluation of Outcomes: Progressing   Progress in Treatment: Attending groups: Yes. Participating in groups: Yes. Taking medication as prescribed: Yes. Toleration medication: Yes. Family/Significant other contact made: No, will contact:    Patient understands diagnosis: Yes. Discussing patient identified problems/goals with staff: Yes. Medical problems stabilized or resolved: Yes. Denies suicidal/homicidal ideation: Yes. Issues/concerns per patient self-inventory: No. Other: Client reports he has no desire to stop drinking.  Client reports he was at home drinking and someone called the police on him.    New problem(s) identified: Client has requested to complete a detox and leave by tomorrow.  Client reports no desire to attend outpatient and or residential treatment.   New Short Term/Long Term Goal(s): Complete detox and leave [facility] by tomorrow.   Patient Goals:  Client reports his only goal is [facility].  Client has informed all staff he has no desire to quit drinking.  Client reports he does not use any other substance.   Discharge Plan or Barriers: Client reports no barriers and has been cooperative with staff.    Reason for Continuation of Hospitalization: Withdrawal symptoms  Estimated Length of Stay:11/08/24 -11/10/24  Last 3 [place] Suicide Severity Risk Score: Flowsheet Row ED from 11/08/2024 in [facility] ED from [facility]07/2024 in [facility] Department at [facility] ED from 11/15/2023 in [facility] Department at [facility]  C-SSRS RISK CATEGORY No Risk No Risk No Risk    Last [facility] 2/9 Scores:    11/09/2024    9:49 AM  Depression screen PHQ 2/9  Decreased Interest 1  Down, Depressed,  Hopeless 1  PHQ - 2 Score 2  Altered sleeping 1  Tired, decreased energy 1  Change in appetite 1  Feeling bad or failure about yourself  1  Trouble concentrating 1  Moving slowly or fidgety/restless 1  Suicidal thoughts 1  PHQ-9 Score 9  Difficult doing work/chores Somewhat difficult    Scribe for Treatment Team: Ozro Russett, LCSW 11/09/2024 3:02 PM

## 2024-11-09 NOTE — Group Note (Signed)
 Group Topic: Social Support  Group Date: 11/09/2024 Start Time: 1130 End Time: 1200 Facilitators: Leron Charolette CROME, RN  Department: [facility]  Number of Participants: 5  Group Focus: goals/reality orientation and self-awareness Treatment Modality:  Individual Therapy Interventions utilized were patient education and support Purpose: increase insight  Name: Robert Briggs Date of Birth: Aug 24, 1996  MR: [id number]    Level of Participation: moderate Quality of Participation: cooperative Interactions with others: fair Mood/Affect: appropriate Triggers (if applicable): na  Cognition: coherent/clear Progress: Moderate Response: progressive  Plan: patient will be encouraged to use resources  Patients Problems:  Patient Active Problem List   Diagnosis Date Noted   Anxiety disorder 11/08/2024   Alcohol use disorder 11/08/2024   Generalized anxiety disorder 04/15/2021   Panic attacks 04/15/2021   Alcohol abuse 04/15/2021

## 2024-11-09 NOTE — ED Notes (Addendum)
 Patient asleep, NAD. CIWA assessment not completed at this time.   Will continue to monitor for safety.

## 2024-11-09 NOTE — Group Note (Addendum)
 Group Topic: Recovery Basics  Group Date: 11/08/2024 Start Time: 2005 End Time: 2105 Facilitators: Nathanael Moulder, NT  Department: [facility]  Number of Participants: 9  Group Focus: abuse issues, check in, chemical dependency education, chemical dependency issues, co-dependency, community group, relapse prevention, and substance abuse education Treatment Modality:  Psychoeducation Interventions utilized were reminiscence, story telling, and support Purpose: enhance coping skills and relapse prevention strategies  Name: Robert Briggs Date of Birth: 1996-04-04  MR: [id number]    Level of Participation: active Quality of Participation: engaged Interactions with others: Appropriate Mood/Affect: appropriate Triggers (if applicable): None noticed Cognition: coherent/clear Progress: Gaining insight Response: Appropriate Plan: patient will be encouraged to attend future groups  Patients Problems:  Patient Active Problem List   Diagnosis Date Noted   Anxiety disorder 11/08/2024   Alcohol use disorder 11/08/2024   Generalized anxiety disorder 04/15/2021   Panic attacks 04/15/2021   Alcohol abuse 04/15/2021

## 2024-11-10 DIAGNOSIS — F10280 Alcohol dependence with alcohol-induced anxiety disorder: Secondary | ICD-10-CM

## 2024-11-10 LAB — VITAMIN D 25 HYDROXY (VIT D DEFICIENCY, FRACTURES): Vit D, 25-Hydroxy: 25.2 ng/mL — ABNORMAL LOW (ref 30–100)

## 2024-11-10 LAB — FOLATE: Folate: 6.4 ng/mL

## 2024-11-10 NOTE — ED Notes (Signed)
 Pt offering good eye contact, able to make needs known, maintained on CIWA, CIWA score =2 /  Pt is on voluntary status.  Affect is flat.  Safety maintained.

## 2024-11-10 NOTE — ED Notes (Addendum)
 Pt observed to turn and reposition during rounds.  In no apparent distress.Safety Maintained.

## 2024-11-10 NOTE — ED Notes (Signed)
 Patient A&O x 4, ambulatory. Patient discharged in no acute distress. Patient denied SI/HI, A/VH upon discharge. Patient verbalized understanding of all discharge instructions reviewed on AVS via staff, to include follow up appointments, RX's and safety. Suicide safety plan completed and reviewed with clinical research associate. A copy given to pt. Patient reported mood 10/10.  Pt belongings returned to patient from locker #25 intact. Patient escorted to lobby via staff for transport to destination. Safety maintained.

## 2024-11-10 NOTE — ED Notes (Signed)
 Patient A&Ox4. Denies intent to harm self/others when asked. Denies A/VH. Patient denies any physical complaints when asked. No acute distress noted. Support and encouragement provided. Routine safety checks conducted according to facility protocol. Encouraged patient to notify staff if thoughts of harm toward self or others arise. Patient verbalize understanding and agreement. Will continue to monitor for safety.

## 2024-11-10 NOTE — ED Provider Notes (Signed)
 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 Sleep is fair but didn't really respond to hydroxyzine  or trazodone. He is eager to discharge today ASAP. He has no interest in AUD treatment at this time including medications such as naltrexone. He did not find hydroxyzine  to be helpful for anxiety. Clonazepam  2mg  provided some anxiety relief but he's reluctant to continue after discharge. Patient makes it clear that he is pre-contemplative with regards to alcohol cessation. 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  Stay Summary: Robert Briggs is a 29 y.o. male with a history of alcohol use d/o who was transferred under IVC status from the [facility] ER, & admitted to the [facility] on 11/07/2024. Pt initially presented to the [place] ER on 12/30 after his friend [name]) petitioned him for involuntary commitment citing suicidal statements in the context of excessive alcohol consumption. Per IVC documentation, friend stated that pt stated he was going to end up killing himself, and that he had been drinking excessively for 3-4 years. Medical records substantiated AUD so patient transferred to [facility] for multimodal treatment.  Patient quickly adjusted to milieu. Patient presented with a calm, coherent narrative that was materially different from information provided in petition. He exhibits limited motivation for change despite the severity of his alcohol consumption, asking to leave, reports not being ready to stop using alcohol states that he uses alcohol in an effort to  self-medicate his anxiety.  Reports being diagnosed with GAD and MDD in the past, shares that he has never found medication that has actually helped him with anxiety.  Reports trials of Klonopin  in the past, Ativan , Prozac , gabapentin , shares that none of these medications were helpful.  He describes symptoms significant for GAD including restlessness, attention, feeling on edge most of the time, with difficulty concentrating, reports that symptoms have been ongoing all of my life, worsening in the past few months.  Patient relays that he was residing at an [place] house for 4.5 months during which he was able to stay sober, and sustain a job (working for [employer]), but left the [employer] last Sunday, 12/28, and went to reside at a motel where he has been using alcohol until the police took went and got him, and took him to the [facility] ER. Per patient this was a planned 'safe place to consume wine while off from work over the holiday. He only enjoyed one day of drinking before being detained by police and taken to [facility] ED.    Pt denies that he was suicidal, denies that he made suicidal statements, but agrees that in the context of intoxication, he might have made some statements which he does not recall making. He shares that when the police came to the motel to get him to take him to the ER, he was sleeping.  During [facility] admission, patient shared that he has been to rehab in the past and that those were not helpful. He reported that he has been to places such as [facility], [address], and that those were not helpful/, and he is no  longer interested. Trial of clonazepam  2mg  x1 was notable for noticeable anxiety relief (objective, subjective) but patient did not find benefits sufficient to continue medication. He tacitly acknowledged he planned to resume drinking.   Given that patient did not meet the statutory requirements for IVC, attending physician rescinded IVC and  patient agreed to voluntary treatment. After inadequate treatment response to clonazepam  2mg  challenge, patient requested discharge to community. He plans to return to previous temporary residence and resume gainful employment next week.  The patient was evaluated each day by a clinical provider to ascertain response to treatment. He repeatedly declined medication assisted treatment, residential, and intensive outpatient treatment of AUD. Patient maintained this baseline from admission through discharge.   Patient's care was discussed during the interdisciplinary team meeting every day during the hospitalization.  Patient was asked each day to complete a self inventory noting mood, mental status, pain, new symptoms, anxiety and concerns. Labs were reviewed with the patient, and abnormal results were discussed with the patient.   Symptoms were reported essentially unchanged at discharge.     The patient denied having side effects to prescribed psychiatric medication, but also noted no significant benefits except for possibly clonazepam  2mg . But given the stipulation that clonazepam  could not be consumed with alcohol and patient's general disdain for medication he declined to continue clonazepam .  Total Time spent with patient: I personally spent 20 minutes on the unit in direct patient care. The direct patient care time included face-to-face time with the patient, reviewing the patient's chart, communicating with other professionals, and coordinating care. Greater than 50% of this time was spent in counseling or coordinating care with the patient regarding goals of hospitalization, psycho-education, and discharge planning needs.   On my assessment the patient denied SI, HI, AVH, paranoia, ideas of reference, or first rank symptoms on day of discharge. Patient denied drug cravings or active signs of withdrawal. Patient denied medication side-effects but did not endorse effectiveness either. Patient was not  deemed to be a danger to self or others on day of discharge and was in agreement with discharge plans.   Past Psychiatric History: AUD, generalized anxiety disorder, r/o panic disorder, r/o ADHD Past Medical History: none reported Family History: none reported Family Psychiatric History: none reported Social History: Previously living in [employer]. Employed full-time. Tobacco Cessation:  N/A, patient does not currently use tobacco products  Current Medications:  Current Facility-Administered Medications  Medication Dose Route Frequency Provider Last Rate Last Admin   acetaminophen  (TYLENOL ) tablet 650 mg  650 mg Oral Q6H PRN Smith, Annie B, NP       alum & mag hydroxide-simeth (MAALOX/MYLANTA) 200-200-20 MG/5ML suspension 30 mL  30 mL Oral Q4H PRN Smith, Annie B, NP       haloperidol (HALDOL) tablet 5 mg  5 mg Oral TID PRN Smith, Annie B, NP       And   diphenhydrAMINE (BENADRYL) capsule 50 mg  50 mg Oral TID PRN Smith, Annie B, NP       haloperidol lactate (HALDOL) injection 5 mg  5 mg Intramuscular TID PRN Smith, Annie B, NP       And   diphenhydrAMINE (BENADRYL) injection 50 mg  50 mg Intramuscular TID PRN Smith, Annie B, NP       And   LORazepam  (ATIVAN ) injection 2 mg  2 mg Intramuscular TID PRN Smith, Annie B, NP       haloperidol lactate (HALDOL) injection 10 mg  10 mg Intramuscular TID  PRN Smith, Annie B, NP       And   diphenhydrAMINE (BENADRYL) injection 50 mg  50 mg Intramuscular TID PRN Smith, Annie B, NP       And   LORazepam  (ATIVAN ) injection 2 mg  2 mg Intramuscular TID PRN Smith, Annie B, NP       hydrOXYzine  (ATARAX ) tablet 25 mg  25 mg Oral TID PRN Smith, Annie B, NP   25 mg at 11/09/24 2135   LORazepam  (ATIVAN ) tablet 1 mg  1 mg Oral Q6H PRN Randall Starlyn HERO, NP       magnesium hydroxide (MILK OF MAGNESIA) suspension 30 mL  30 mL Oral Daily PRN Smith, Annie B, NP       thiamine  (VITAMIN B1) tablet 100 mg  100 mg Oral Daily Smith, Annie B, NP   100 mg at  11/09/24 9072   traZODone (DESYREL) tablet 50 mg  50 mg Oral QHS Smith, Annie B, NP   50 mg at 11/09/24 2135   No current outpatient medications on file.    PTA Medications:  Facility Ordered Medications  Medication   hydrOXYzine  (ATARAX ) tablet 25 mg   thiamine  (VITAMIN B1) tablet 100 mg   traZODone (DESYREL) tablet 50 mg   acetaminophen  (TYLENOL ) tablet 650 mg   alum & mag hydroxide-simeth (MAALOX/MYLANTA) 200-200-20 MG/5ML suspension 30 mL   magnesium hydroxide (MILK OF MAGNESIA) suspension 30 mL   haloperidol (HALDOL) tablet 5 mg   And   diphenhydrAMINE (BENADRYL) capsule 50 mg   haloperidol lactate (HALDOL) injection 5 mg   And   diphenhydrAMINE (BENADRYL) injection 50 mg   And   LORazepam  (ATIVAN ) injection 2 mg   haloperidol lactate (HALDOL) injection 10 mg   And   diphenhydrAMINE (BENADRYL) injection 50 mg   And   LORazepam  (ATIVAN ) injection 2 mg   LORazepam  (ATIVAN ) tablet 1 mg   [COMPLETED] clonazePAM  (KLONOPIN ) tablet 2 mg       11/10/2024   10:30 AM 11/09/2024    9:49 AM  Depression screen PHQ 2/9  Decreased Interest 1 1  Down, Depressed, Hopeless 1 1  PHQ - 2 Score 2 2  Altered sleeping 0 1  Tired, decreased energy 1 1  Change in appetite 1 1  Feeling bad or failure about yourself  0 1  Trouble concentrating 0 1  Moving slowly or fidgety/restless 0 1  Suicidal thoughts 0 1  PHQ-9 Score 4 9  Difficult doing work/chores  Somewhat difficult    Flowsheet Row ED from 11/08/2024 in [facility] ED from 11/07/2024 in [facility] Emergency Department at [facility] ED from 11/15/2023 in [facility] Emergency Department at [facility]  C-SSRS RISK CATEGORY No Risk No Risk No Risk    Musculoskeletal  Strength & Muscle Tone: within normal limits Gait & Station: normal Patient leans: N/A  Psychiatric Specialty Exam  Presentation  General Appearance:  Appropriate for Environment  Eye Contact: Good  Speech: Clear  and Coherent  Speech Volume: Decreased  Handedness: Right   Mood and Affect  Mood: Anxious  Affect: Congruent   Thought Process  Thought Processes: Coherent  Descriptions of Associations:Intact  Orientation:Full (Time, Place and Person)  Thought Content:Logical  Diagnosis of Schizophrenia or Schizoaffective disorder in past: No data recorded   Hallucinations:Hallucinations: None  Ideas of Reference:None  Suicidal Thoughts:Suicidal Thoughts: No  Homicidal Thoughts:Homicidal Thoughts: No   Sensorium  Memory: Immediate Fair; Recent Fair  Judgment: Fair  Insight: Lacking  Executive Functions  Concentration: Fair  Attention Span: Fair  Recall: [employer] of Knowledge: Fair  Language: Fair   Psychomotor Activity  Psychomotor Activity: Psychomotor Activity: Normal   Assets  Assets: Leisure Time; Resilience; Vocational/Educational   Sleep  Sleep: Sleep: Good  Estimated Sleeping Duration (Last 24 Hours): 9.50-11.75 hours  Nutritional Assessment (For OBS and FBC admissions only) Has the patient had a weight loss or gain of 10 pounds or more in the last 3 months?: No Has the patient had a decrease in food intake/or appetite?: No Does the patient have dental problems?: No Does the patient have eating habits or behaviors that may be indicators of an eating disorder including binging or inducing vomiting?: No Has the patient recently lost weight without trying?: 0    Physical Exam  Physical Exam ROS Blood pressure 113/86, pulse 73, temperature 97.8 F (36.6 C), temperature source Oral, resp. rate 20, SpO2 100%. There is no height or weight on file to calculate BMI.  Demographic Factors:  Male, Adolescent or young adult, Low socioeconomic status, and Living alone  Loss Factors: NA  Historical Factors: Family history of mental illness or substance abuse and Impulsivity  Risk Reduction Factors:   Employed  Continued Clinical  Symptoms:  Severe Anxiety and/or Agitation Panic Attacks Depression:   Comorbid alcohol abuse/dependence Impulsivity Insomnia Alcohol/Substance Abuse/Dependencies Unstable or Poor Therapeutic Relationship  Cognitive Features That Contribute To Risk:  Polarized thinking    Suicide Risk:  Mild:  Suicidal ideation of limited frequency, intensity, duration, and specificity.  There are no identifiable plans, no associated intent, mild dysphoria and related symptoms, good self-control (both objective and subjective assessment), few other risk factors, and identifiable protective factors, including available and accessible social support.  Plan Of Care/Follow-up recommendations:  Encouraged patient to followup/explore community-based alcohol treatment programs. Information provided to patient in AVS.  Diet: Rich in produce, whole (minimally processed) grains, nuts/seeds, eggs, beans/legumes, seafood, full fat dairy (if tolerated), fermented food, lean meat. Copious water intake. Limit (ultra)processed foods and sugar-sweetened beverages.    Other: -Follow-up with your outpatient psychiatric provider -instructions on appointment date, time, and address (location) are provided to you in discharge paperwork. [facility], 12 step.   -Take your psychiatric medications as prescribed at discharge - instructions are provided to you in the discharge paperwork. Patient on no daily medication.   -Follow-up with outpatient primary care doctor to optimize health maintenance/preventative care and other specialists -for management of chronic medical disease  -Recommend total abstinence from alcohol, tobacco, and other illicit drug use at discharge.    -If your psychiatric symptoms recur, worsen, or if you have side effects to your psychiatric medications, call your outpatient psychiatric provider, 911, [phone] or go to the nearest emergency department.   -If suicidal thoughts occur, immediately call your outpatient  psychiatric provider, 911, [phone] or go to the nearest emergency department.  Disposition: Discharge to community  KANDI JAYSON HAHN, MD 11/10/2024, 10:33 AM

## 2024-11-10 NOTE — Care Management (Addendum)
 FBC Care Management...  Patient declined inpatient and out patient services  Per provider IVC discontinued/resended  Patient scheduled for discharge today Friday 11/10/2024  Patient will discharge to...   [facility] [address],  [place], [place] [address] [phone]  RN to arrange transportation

## 2024-11-13 ENCOUNTER — Encounter: Payer: Self-pay | Admitting: Emergency Medicine

## 2024-11-13 ENCOUNTER — Emergency Department
Admission: EM | Admit: 2024-11-13 | Discharge: 2024-11-14 | Disposition: A | Discharge status: Home / Self Care | Payer: Self-pay

## 2024-11-13 ENCOUNTER — Other Ambulatory Visit: Payer: Self-pay

## 2024-11-13 DIAGNOSIS — F1094 Alcohol use, unspecified with alcohol-induced mood disorder: Secondary | ICD-10-CM

## 2024-11-13 DIAGNOSIS — Y908 Blood alcohol level of 240 mg/100 ml or more: Secondary | ICD-10-CM | POA: Insufficient documentation

## 2024-11-13 DIAGNOSIS — F10920 Alcohol use, unspecified with intoxication, uncomplicated: Secondary | ICD-10-CM

## 2024-11-13 DIAGNOSIS — F17210 Nicotine dependence, cigarettes, uncomplicated: Secondary | ICD-10-CM | POA: Insufficient documentation

## 2024-11-13 DIAGNOSIS — F10929 Alcohol use, unspecified with intoxication, unspecified: Secondary | ICD-10-CM

## 2024-11-13 DIAGNOSIS — F1014 Alcohol abuse with alcohol-induced mood disorder: Secondary | ICD-10-CM | POA: Insufficient documentation

## 2024-11-13 DIAGNOSIS — F419 Anxiety disorder, unspecified: Secondary | ICD-10-CM | POA: Insufficient documentation

## 2024-11-13 LAB — CBC
HCT: 46.2 % (ref 39.0–52.0)
Hemoglobin: 16.5 g/dL (ref 13.0–17.0)
MCH: 31.7 pg (ref 26.0–34.0)
MCHC: 35.7 g/dL (ref 30.0–36.0)
MCV: 88.7 fL (ref 80.0–100.0)
Platelets: 198 K/uL (ref 150–400)
RBC: 5.21 MIL/uL (ref 4.22–5.81)
RDW: 11.4 % — ABNORMAL LOW (ref 11.5–15.5)
WBC: 6.3 K/uL (ref 4.0–10.5)
nRBC: 0 % (ref 0.0–0.2)

## 2024-11-13 LAB — BASIC METABOLIC PANEL WITH GFR
Anion gap: 16 — ABNORMAL HIGH (ref 5–15)
BUN: 5 mg/dL — ABNORMAL LOW (ref 6–20)
CO2: 27 mmol/L (ref 22–32)
Calcium: 8.8 mg/dL — ABNORMAL LOW (ref 8.9–10.3)
Chloride: 103 mmol/L (ref 98–111)
Creatinine, Ser: 0.68 mg/dL (ref 0.61–1.24)
GFR, Estimated: >60 mL/min
Glucose, Bld: 117 mg/dL — ABNORMAL HIGH (ref 70–99)
Potassium: 4.1 mmol/L (ref 3.5–5.1)
Sodium: 146 mmol/L — ABNORMAL HIGH (ref 135–145)

## 2024-11-13 LAB — ETHANOL: Alcohol, Ethyl (B): 437 mg/dL

## 2024-11-13 LAB — URINE DRUG SCREEN
Amphetamines: NEGATIVE
Barbiturates: NEGATIVE
Benzodiazepines: NEGATIVE
Cocaine: NEGATIVE
Fentanyl: NEGATIVE
Methadone Scn, Ur: NEGATIVE
Opiates: NEGATIVE
Tetrahydrocannabinol: NEGATIVE

## 2024-11-13 MED ORDER — LORAZEPAM 2 MG/ML IJ SOLN
0.0000 mg | Freq: Four times a day (QID) | INTRAMUSCULAR | Status: DC
  Administered 2024-11-13: 1 mg via INTRAVENOUS
  Filled 2024-11-13: qty 1

## 2024-11-13 MED ORDER — DIPHENHYDRAMINE HCL 25 MG PO CAPS
25.0000 mg | ORAL_CAPSULE | Freq: Once | ORAL | Status: AC
  Administered 2024-11-13: 25 mg via ORAL
  Filled 2024-11-13: qty 1

## 2024-11-13 MED ORDER — THIAMINE HCL 100 MG/ML IJ SOLN: 100.0000 mg | Freq: Every day | INTRAMUSCULAR | Status: DC

## 2024-11-13 MED ORDER — THIAMINE MONONITRATE 100 MG PO TABS
100.0000 mg | ORAL_TABLET | Freq: Every day | ORAL | Status: DC
  Administered 2024-11-13 – 2024-11-14 (×2): 100 mg via ORAL
  Filled 2024-11-13 (×2): qty 1

## 2024-11-13 MED ORDER — LORAZEPAM 2 MG/ML IJ SOLN: 0.0000 mg | Freq: Two times a day (BID) | INTRAMUSCULAR | Status: DC

## 2024-11-13 MED ORDER — LORAZEPAM 2 MG PO TABS: 0.0000 mg | ORAL_TABLET | Freq: Two times a day (BID) | ORAL | Status: DC

## 2024-11-13 MED ORDER — LORAZEPAM 2 MG PO TABS
0.0000 mg | ORAL_TABLET | Freq: Four times a day (QID) | ORAL | Status: DC
  Administered 2024-11-13: 2 mg via ORAL
  Administered 2024-11-14: 1 mg via ORAL
  Filled 2024-11-13 (×2): qty 1

## 2024-11-13 MED ORDER — ACETAMINOPHEN 325 MG PO TABS
975.0000 mg | ORAL_TABLET | Freq: Once | ORAL | Status: AC
  Administered 2024-11-13: 975 mg via ORAL
  Filled 2024-11-13: qty 3

## 2024-11-13 MED ORDER — ONDANSETRON HCL 4 MG/2ML IJ SOLN
4.0000 mg | Freq: Once | INTRAMUSCULAR | Status: AC
  Administered 2024-11-13: 4 mg via INTRAVENOUS
  Filled 2024-11-13: qty 2

## 2024-11-13 NOTE — ED Provider Notes (Signed)
 "  [facility] Provider Note    Event Date/Time   First MD Initiated Contact with Patient 11/13/24 1503     (approximate)   History   Alcohol Intoxication   HPI  Robert Briggs is a 29 y.o. male brought in on IVC due to concern of alcohol intoxication.  Was just recently discharged from our facility for concerns of suicidal statements made while intoxicated.  I reviewed his psychiatric note, seems that he plan to continue with alcohol usage at home but at the time he was endorsing that he is planning to follow-up at work and was not suicidal.  Apparently police called to hotel room today due to concern of suicidality, apparently he had told his friend that if he started drinking again he would kill himself.  Father contacted police and had the patient brought in on IVC.  Patient currently intoxicated having trouble providing much information at this time.     Physical Exam   Triage Vital Signs: ED Triage Vitals  Encounter Vitals Group     BP      Girls Systolic BP Percentile      Girls Diastolic BP Percentile      Boys Systolic BP Percentile      Boys Diastolic BP Percentile      Pulse      Resp      Temp      Temp src      SpO2      Weight      Height      Head Circumference      Peak Flow      Pain Score      Pain Loc      Pain Education      Exclude from Growth Chart     Most recent vital signs: Vitals:   11/13/24 1527  BP: (!) 98/49  Pulse: 96  Resp: 16  Temp: 98.3 F (36.8 C)  SpO2: 95%     General: Awake, intoxicated CV:  Good peripheral perfusion.  Resp:  Normal effort.  Able to speak in full sentences with slurred speech Abd:  No distention.  Soft nontender Other:     ED Results / Procedures / Treatments   Labs (all labs ordered are listed, but only abnormal results are displayed) Labs Reviewed  ETHANOL - Abnormal; Notable for the following components:      Result Value   Alcohol, Ethyl (B) 437 (*)    All other  components within normal limits  BASIC METABOLIC PANEL WITH GFR - Abnormal; Notable for the following components:   Sodium 146 (*)    Glucose, Bld 117 (*)    BUN 5 (*)    Calcium 8.8 (*)    Anion gap 16 (*)    All other components within normal limits  CBC - Abnormal; Notable for the following components:   RDW 11.4 (*)    All other components within normal limits  URINE DRUG SCREEN     EKG     RADIOLOGY   PROCEDURES:  Critical Care performed: No  Procedures   MEDICATIONS ORDERED IN ED: Medications  LORazepam  (ATIVAN ) injection 0-4 mg (1 mg Intravenous Given 11/13/24 1745)    Or  LORazepam  (ATIVAN ) tablet 0-4 mg ( Oral See Alternative 11/13/24 1745)  LORazepam  (ATIVAN ) injection 0-4 mg (has no administration in time range)    Or  LORazepam  (ATIVAN ) tablet 0-4 mg (has no administration in time range)  thiamine  (VITAMIN  B1) tablet 100 mg (100 mg Oral Given 11/13/24 1610)    Or  thiamine  (VITAMIN B1) injection 100 mg ( Intravenous See Alternative 11/13/24 1610)  ondansetron  (ZOFRAN ) injection 4 mg (4 mg Intravenous Given 11/13/24 1609)  acetaminophen  (TYLENOL ) tablet 975 mg (975 mg Oral Given 11/13/24 1610)  diphenhydrAMINE  (BENADRYL ) capsule 25 mg (25 mg Oral Given 11/13/24 1807)     IMPRESSION / MDM / ASSESSMENT AND PLAN / ED COURSE  I reviewed the triage vital signs and the nursing notes.                               Patient's presentation is most consistent with acute complicated illness / injury requiring diagnostic workup.  29 year old male who presents today with concern of alcohol intoxication and suicidal statements.  Unclear if made while clinically intoxicated or for other reason.  Regardless history of extensive alcohol abuse, per police multiple bottles of empty wine found in his hotel room.  Will follow-up labs have patient evaluated by psychiatry and determine further workup accordingly.  The patient has been placed in psychiatric observation due to the need to  provide a safe environment for the patient while obtaining psychiatric consultation and evaluation, as well as ongoing medical and medication management to treat the patient's condition.  The patient has been placed under full IVC at this time.    Clinical Course as of 11/13/24 1944  Mon Nov 13, 2024  1603 Patient states that he feels like he is having alcohol withdrawal.  Given his current presentation and his degree of intoxication I feel this is very unlikely.  No evidence of tremors, or other external symptoms of withdrawal.  Will give him some Zofran  and Tylenol  here and monitor closely if he does start demonstrating further symptoms of withdrawal he is already on CIWA and we will treat it as needed. [SK]  1627 Alcohol of 437, will be medically sober at 1:20 AM. [SK]    Clinical Course User Index [SK] Fernand Rossie HERO, MD     FINAL CLINICAL IMPRESSION(S) / ED DIAGNOSES   Final diagnoses:  Alcoholic intoxication without complication     Rx / DC Orders   ED Discharge Orders     None        Note:  This document was prepared using Dragon voice recognition software and may include unintentional dictation errors.   Fernand Rossie HERO, MD 11/13/24 1944  "

## 2024-11-13 NOTE — ED Notes (Signed)
 Pt standing in hallway asking for help by this nurse. Pt states he needs medication to help him sleep and requested benadryl  or something because of withdrawing from alcohol. Pt reassured he had received medication just before 1900 and would be receiving more again in a couple of hours depending on the severity of his symptoms. Pt verbalized understanding. This nurse provided for pt comfort and safety.

## 2024-11-13 NOTE — Consult Note (Signed)
 [facility] Consult Note  Patient Name: Robert Briggs MRN: [id number] DOB: 07/20/1996 DATE OF Consult: 11/13/2024 Consult Order details:  Orders (From admission, onward)     Start     Ordered   11/13/24 1514  CONSULT TO CALL ACT TEAM       Ordering Provider: Fernand Rossie HERO, MD  Provider:  (Not yet assigned)  Question:  Reason for Consult?  Answer:  Psych consult   11/13/24 1513   11/13/24 1514  IP CONSULT TO PSYCHIATRY       Ordering Provider: Fernand Rossie HERO, MD  Provider:  (Not yet assigned)  Question:  Reason for consult:  Answer:  Medication management   11/13/24 1513            PRIMARY PSYCHIATRIC DIAGNOSES  1.  Alcohol use disorder 2.  Alcohol intoxication 3.  Alcohol induced mood disorder  RECOMMENDATIONS  Recommendations: Medication recommendations: none as the patient is refusing medications Non-Medication/therapeutic recommendations: patient has no insight into his drinking problem, information on alcohol abuse and treatment options Is inpatient psychiatric hospitalization recommended for this patient? No (Explain why): patient denies SI/HI when sober.  Is another care setting recommended for this patient? (examples may include Crisis Stabilization Unit, Residential/Recovery Treatment, ALF/SNF, Memory Care Unit)  Yes (Explain why): If patient will go he would benefit from a rehab unit. Follow-Up Telepsychiatry C/L services: We will sign off for now. Please re-consult our service if needed for any concerning changes in the patient's condition, discharge planning, or questions. Communication: Treatment team members (and family members if applicable) who were involved in treatment/care discussions and planning, and with whom we spoke or engaged with via secure text/chat, include the following: Treatment team, RN, SW via [employer]  I personally spent a total of 70 minutes in the care of the patient today including preparing to see the patient, getting/reviewing  separately obtained history, performing a medically appropriate exam/evaluation, counseling and educating, referring and communicating with other health care professionals, documenting clinical information in the EHR, communicating results, and speaking with family.  Thank you for involving us  in the care of this patient. If you have any additional questions or concerns, please call [phone] and ask for me or the provider on-call.  TELEPSYCHIATRY ATTESTATION & CONSENT  As the provider for this telehealth consult, I attest that I verified the patients identity using two separate identifiers, introduced myself to the patient, provided my credentials, disclosed my location, and performed this encounter via a HIPAA-compliant, real-time, face-to-face, two-way, interactive audio and video platform and with the full consent and agreement of the patient (or guardian as applicable.)  Patient physical location: [place] [facility]. Telehealth provider physical location: home office in state of [place] .  Video start time: 2220 (Central Time) Video end time: 2235 [facility] Time) Call to father, Mr Aultman [phone] mins  IDENTIFYING DATA  Robert Briggs is a 29 y.o. year-old male for whom a psychiatric consultation has been ordered by the primary provider. The patient was identified using two separate identifiers.  CHIEF COMPLAINT/REASON FOR CONSULT  Making suicidal statements while intoxicated  HISTORY OF PRESENT ILLNESS (HPI)  The patient was seen for the same thing a week ago and was admitted for a day and then discharged. When EMS got him today he had drank 6 liters of wine and 2 bottles of vanilla extract. He told them that he was drinking himself to death. They were called due to his father calling 911 because of his suicidal statements. Because  the patient is not suicidal when sober and does not want to stop drinking and does not want medications he was discharged quickly last week.  The patient  still feels the same way and denies wanting any medications and does not want to stop drinking. He says that he is aware that he is harming his body and brain by doing all this excessive drinking. But that does not factor into his decision making. He was very minimizing of his drinking and his impact on his life. He claims to be working full time. I asked if he had insurance and he stated no. When asked who was paying for the ambulance rides to the hospital, the ed visits and the hospital visit last week, he shrugged his shoulders and said he had no idea. I pointed out to him that all of the bills will be sent to him. I also asked him to be aware of the impact of his actions on his friend who called 911 last week and his father today. I educated him that his actions impact the people around him. He indicated that he would go to his parents when he left the ED.  He stated, my parents will want to take care of me.   I asked for his father's phone number and he willing gave it to me.  Father, Mr. Benham, [phone], stated that the patient fell off the wagon 2 weeks ago. He was kicked out of [place] house and can't back for two weeks and they have to vote on it. They may not take him back unless he is working on getting sober. He is not allowed at this parents house due to his drinking. He has not been working for 2 weeks due to his drinking and likely does not have a job at this point. Father is very worried that he will kill himself. Attempts were made to help father understand that when it comes to addictions we can't force someone into treatment or into a psych facility. Many psychiatric hospitals don't treat addictions, that is what rehab is for. Also, there are some dual diagnosis programs but the patient has to want to go. He stressed that his boy is sick and I agreed. He is sick, with an addiction and not something that I can force into a psych hospital or rehab.  Father then insisted that the [facility]  that was with his son assured him that [facility] would be there to interview the patient and get him to rehab. I advised that I don't know if [facility] does that at this hospital but even if they did, the patient has to agree to go. No one can force them into treatment, until it gets to the point of criminal activity and can be court ordered at that point.   PAST PSYCHIATRIC HISTORY  Inpatient once but was only there 1 night.  Denies Suicide attempts. Has many excuses about how he did not say he was suicidal while drinking it was everyone else making things up. No medications.  Tried 1 day of therapy but states it wasn't for me. Otherwise as per HPI above.  PAST MEDICAL HISTORY  Past Medical History:  Diagnosis Date   Alcohol abuse      HOME MEDICATIONS  Facility Ordered Medications  Medication   LORazepam  (ATIVAN ) injection 0-4 mg   Or   LORazepam  (ATIVAN ) tablet 0-4 mg   [START ON 11/15/2024] LORazepam  (ATIVAN ) injection 0-4 mg   Or   [START ON 11/15/2024]  LORazepam  (ATIVAN ) tablet 0-4 mg   thiamine  (VITAMIN B1) tablet 100 mg   Or   thiamine  (VITAMIN B1) injection 100 mg   [COMPLETED] ondansetron  (ZOFRAN ) injection 4 mg   [COMPLETED] acetaminophen  (TYLENOL ) tablet 975 mg   [COMPLETED] diphenhydrAMINE  (BENADRYL ) capsule 25 mg     ALLERGIES  Allergies[1]  SOCIAL & SUBSTANCE USE HISTORY  Social History   Socioeconomic History   Marital status: Single    Spouse name: Not on file   Number of children: Not on file   Years of education: Not on file   Highest education level: Not on file  Occupational History   Not on file  Tobacco Use   Smoking status: Every Day    Types: Cigarettes   Smokeless tobacco: Never  Vaping Use   Vaping status: Never Used  Substance and Sexual Activity   Alcohol use: Yes    Comment: daily   Drug use: Not Currently   Sexual activity: Not on file  Other Topics Concern   Not on file  Social History Narrative   Not on file   Social Drivers of Health    Tobacco Use: High Risk (11/13/2024)   Patient History    Smoking Tobacco Use: Every Day    Smokeless Tobacco Use: Never    Passive Exposure: Not on file  Financial Resource Strain: Not on file  Food Insecurity: No Food Insecurity (11/08/2024)   Epic    Worried About Programme Researcher, Broadcasting/film/video in the Last Year: Never true    Ran Out of Food in the Last Year: Never true  Transportation Needs: No Transportation Needs (11/08/2024)   Epic    Lack of Transportation (Medical): No    Lack of Transportation (Non-Medical): No  Physical Activity: Not on file  Stress: Not on file  Social Connections: Not on file  Depression (PHQ2-9): Low Risk (11/10/2024)   Depression (PHQ2-9)    PHQ-2 Score: 4  Recent Concern: Depression (PHQ2-9) - Medium Risk (11/09/2024)   Depression (PHQ2-9)    PHQ-2 Score: 9  Alcohol Screen: Not on file  Housing: Not on file  Utilities: Not At Risk (11/08/2024)   Epic    Threatened with loss of utilities: No  Health Literacy: Not on file   Tobacco Use History[2] Social History   Substance and Sexual Activity  Alcohol Use Yes   Comment: daily   Social History   Substance and Sexual Activity  Drug Use Not Currently    Additional pertinent information .  FAMILY HISTORY  History reviewed. No pertinent family history. Family Psychiatric History (if known):  Father states he, himself is a functional alcoholic, drinks daily but goes to work and does not get black out drunk.  Grandparents on both sides of the family had alcoholism.   MENTAL STATUS EXAM (MSE)  Mental Status Exam: General Appearance: Disheveled  Orientation:  Other:  aware that he is at the hospital, but thinks it is for withdrawals, oriented to self, place, not situation  Memory:  very poor currently, also things he said as truths were not truths after talking to his father  Concentration:  Concentration: Poor  Recall:  NA  Attention  Poor  Eye Contact:  Poor  Speech:  Slow  Language:  Fair  Volume:   Normal  Mood: patient states his mood is fine  Affect:  Flat  Thought Process:  Disorganized, poor decision making, does not make connections  Thought Content:  Illogical  Suicidal Thoughts:  No, while sober  Homicidal Thoughts:  No  Judgement:  Poor  Insight:  Lacking  Psychomotor Activity:  Restlessness  Akathisia:  No  Fund of Knowledge:  Poor    Assets:  Others:  patient listed a lot of assets that when we talked to father were not real.   Cognition:  WNL  ADL's:  Intact  AIMS (if indicated):       VITALS  Blood pressure 130/88, pulse (!) 116, temperature 97.9 F (36.6 C), temperature source Oral, resp. rate 18, height 5' 10 (1.778 m), weight 68 kg, SpO2 97%.  LABS  Admission on 11/13/2024  Component Date Value Ref Range Status   Alcohol, Ethyl (B) 11/13/2024 437 (HH)  <15 mg/dL Final   Comment: Critical Value, Read Back and verified with ANGELA ROBBINS AT 1628 ON 11/13/24 BY SS (NOTE) For medical purposes only. Performed at [facility], [address] [place], [place] [address]    Sodium 11/13/2024 146 (H)  135 - 145 mmol/L Final   Potassium 11/13/2024 4.1  3.5 - 5.1 mmol/L Final   Chloride 11/13/2024 103  98 - 111 mmol/L Final   CO2 11/13/2024 27  22 - 32 mmol/L Final   Glucose, Bld 11/13/2024 117 (H)  70 - 99 mg/dL Final   Glucose reference range applies only to samples taken after fasting for at least 8 hours.   BUN 11/13/2024 5 (L)  6 - 20 mg/dL Final   Creatinine, Ser 11/13/2024 0.68  0.61 - 1.24 mg/dL Final   Calcium 98/94/7973 8.8 (L)  8.9 - 10.3 mg/dL Final   GFR, Estimated 11/13/2024 >60  >60 mL/min Final   Comment: (NOTE) Calculated using the CKD-EPI Creatinine Equation (2021)    Anion gap 11/13/2024 16 (H)  5 - 15 Final   Performed at [facility], [address] [place], [place] [address]   WBC 11/13/2024 6.3  4.0 - 10.5 K/uL Final   RBC 11/13/2024 5.21  4.22 - 5.81 MIL/uL Final   Hemoglobin 11/13/2024 16.5  13.0 - 17.0 g/dL  Final   HCT 98/94/7973 46.2  39.0 - 52.0 % Final   MCV 11/13/2024 88.7  80.0 - 100.0 fL Final   MCH 11/13/2024 31.7  26.0 - 34.0 pg Final   MCHC 11/13/2024 35.7  30.0 - 36.0 g/dL Final   RDW 98/94/7973 11.4 (L)  11.5 - 15.5 % Final   Platelets 11/13/2024 198  150 - 400 K/uL Final   nRBC 11/13/2024 0.0  0.0 - 0.2 % Final   Performed at [facility], [address] [place], [place] [address]   Opiates 11/13/2024 NEGATIVE  NEGATIVE Final   Cocaine 11/13/2024 NEGATIVE  NEGATIVE Final   Benzodiazepines 11/13/2024 NEGATIVE  NEGATIVE Final   Amphetamines 11/13/2024 NEGATIVE  NEGATIVE Final   Tetrahydrocannabinol 11/13/2024 NEGATIVE  NEGATIVE Final   Barbiturates 11/13/2024 NEGATIVE  NEGATIVE Final   Methadone Scn, Ur 11/13/2024 NEGATIVE  NEGATIVE Final   Fentanyl  11/13/2024 NEGATIVE  NEGATIVE Final   Comment: (NOTE) Drug screen is for Medical Purposes only. Positive results are preliminary only. If confirmation is needed, notify lab within 5 days.  Drug Class                 Cutoff (ng/mL) Amphetamine and metabolites 1000 Barbiturate and metabolites 200 Benzodiazepine              200 Opiates and metabolites     300 Cocaine and metabolites     300 THC  50 Fentanyl                     5 Methadone                   300  Trazodone  is metabolized in vivo to several metabolites,  including pharmacologically active m-CPP, which is excreted in the  urine.  Immunoassay screens for amphetamines and MDMA have potential  cross-reactivity with these compounds and may provide false positive  result.  Performed at [facility], [address], [place], [place] [address]     PSYCHIATRIC REVIEW OF SYSTEMS (ROS)  ROS: Notable for the following relevant positive findings: ROS  Additional findings:      Musculoskeletal: No abnormal movements observed      Gait & Station: Laying/Sitting      Pain Screening: Denies      Nutrition & Dental Concerns: no  concerns  RISK FORMULATION/ASSESSMENT  Is the patient experiencing any suicidal or homicidal ideations: No       Protective factors considered for safety management: all of patient's protective factors were lies- he cannot go to his parents house, he does not currently have a job, is not suicidal but fully intends to continue drinking and will be suicidal when he drinks again, but he denies this because he is not suicidal now so therefore he was not suicidal while drunk. Patient has very poor insight.  Risk factors/concerns considered for safety management:  Depression Substance abuse/dependence Impulsivity Unwillingness to seek help Male gender  Is there a safety management plan with the patient and treatment team to minimize risk factors and promote protective factors: Yes           Explain: patient is in the psych area of the ED Is crisis care placement or psychiatric hospitalization recommended: No     Based on my current evaluation and risk assessment, patient is determined at this time to be at:  Moderate Risk  *RISK ASSESSMENT Risk assessment is a dynamic process; it is possible that this patient's condition, and risk level, may change. This should be re-evaluated and managed over time as appropriate. Please re-consult psychiatric consult services if additional assistance is needed in terms of risk assessment and management. If your team decides to discharge this patient, please advise the patient how to best access emergency psychiatric services, or to call 911, if their condition worsens or they feel unsafe in any way.   Vicky Mccanless A Jacque Garrels, NP Telepsychiatry Consult Services    [1] No Known Allergies [2]  Social History Tobacco Use  Smoking Status Every Day   Types: Cigarettes  Smokeless Tobacco Never

## 2024-11-13 NOTE — ED Notes (Signed)
 IVC PENDING  CONSULT ?

## 2024-11-13 NOTE — ED Notes (Signed)
 Pt reports that he feels like he is having withdrawal symptoms. Pt will tell not this RN or MD specific symptoms he is having. Pt is does not have visible tremors at this time. Pt answers yes to all other questions.

## 2024-11-13 NOTE — ED Notes (Signed)
 Pt dressed out by this RN and Vet, CHARITY FUNDRAISER. Pt had with him the following:  1 Blue shirt 1 pair of Khaki pants 1 pair of boxer underwear

## 2024-11-13 NOTE — BH Assessment (Signed)
 Patient was deferred to [facility] for a telepsych assessment. The assigned care coordinator will provide updates regarding the scheduling of the assessment. [facility] coordinator can be reached at [phone] for further information on the timing of the telepsych evaluation.

## 2024-11-13 NOTE — ED Notes (Signed)
 Pt standing in doorway asking for RN. This RN went over to speak with pt. Pt states can you help me. RN asked pt what he needed help with. Pt states I need help with a lot. Pt states that he does not feel good, pt reports having nausea and wanting to sleep. RN suggested pt try to rest but pt states that he is unable to sleep and would like something to help him sleep. Secure Chat sent to EDP.

## 2024-11-13 NOTE — ED Notes (Signed)
 Dinner tray provided to pt

## 2024-11-13 NOTE — ED Triage Notes (Signed)
 Pt to ED via [facility]. Pt is currently under IVC. Pt was found at [employer], pt has been drinking and per [employer] who is with pt, pt drink approximately 6 liters of wine. EMS reports that there was 2.5 large bottles of wine and several empty boxes of wine. Pt also had 2 bottles of vanilla extract. Pt made statements that he was going to drink until he kills himself.   Pt currently unable to participate in assessment due to level of intoxication.    Pt was given 500 mL NS bolus by EMS in route.

## 2024-11-13 NOTE — BH Assessment (Addendum)
 Writer unable to assess patient due to intoxication. BAL is 437. Will try back at later time.

## 2024-11-13 NOTE — ED Notes (Signed)
 EDP at bedside speaking with pt again per his request.

## 2024-11-14 MED ORDER — DIPHENHYDRAMINE HCL 25 MG PO CAPS
25.0000 mg | ORAL_CAPSULE | Freq: Once | ORAL | Status: AC
  Administered 2024-11-14: 25 mg via ORAL
  Filled 2024-11-14: qty 1

## 2024-11-14 NOTE — ED Notes (Signed)
 Medication given per order. Pt asking when he will be getting more medication and asked if we will continue to give him medication because he is having such bad withdrawals. Pt reassured hospital staff would continue to take good care of him and educated about how medications were given at certain times so it would be a few hours before he got more. Pt verbalized understanding.

## 2024-11-14 NOTE — ED Notes (Signed)
 IVC/ pending recommendations

## 2024-11-14 NOTE — ED Notes (Addendum)
 Sgt Ward called to pick up pt. DC papers reviewed with pt. Pt Dc'd with Sgt Ward

## 2024-11-14 NOTE — BH Assessment (Signed)
 Comprehensive Clinical Assessment (CCA) Note   11/13/2024 Robert Briggs [id number]  Disposition: Per Sharren, NP, patient is recommended for discharge with outpatient follow-up. Pt is not seeking treatment for alcohol abuse.    The patient demonstrates the following risk factors for suicide: Chronic risk factors for suicide include: substance use disorder and demographic factors (male, >29 y/o). Acute risk factors for suicide include: unemployment and social withdrawal/isolation. Protective factors for this patient include: positive social support. Considering these factors, the overall suicide risk at this point appears to be low. Patient is appropriate for outpatient follow up.   Patient is a 29 year old male with a history of anxiety disorder who presents involuntarily to [facility] ED for an assessment. Patient resides in the hotel alone but identifies his parents as their primary support system.Patient was recently discharged from [facility] after requesting to be discharged. Pt immediately began drinking. Pt reports that he uses drinking as a way to cope with life stressors. . Patient denies history of past suicide attempts but per chart, pt makes suicidal statement when he becomes intoxicated.  Patient has a hx of Substance Abuse: ETOH  Last use was today .Patient denies NSSIB, SI, HI, AVH.   Patient denies history of abuse or trauma. Patient denies current legal problems. Patient is not receiving outpatient therapy and psychiatry services. Patient reports he does not take any  medications.Patient reports previous inpatient admission at [facility] for detox in 11/09/2024.  Patient denies access to weapons.   Patient is able to to contract for safety outside of the hospital.  Patient gives verbal consent for provider to  contact his father.  Per Tenbrook, NP, father reports that pt is not allowed to return to parent's home and is in need of some type of rehab/detox treatment. Tenbrook, NP explained to father that  pt had to be willing to seek treatment for his alcohol abuse and at this time pt denies wanting to seek treatment.  During evaluation pt is in no acute distress. He is alert, oriented x 4, calm, cooperative and attentive. his mood is anxious and flat with congruent affect. He has slurred and slow speech, and behavior.  Objectively there is no evidence of psychosis/mania or delusional thinking.  Patient is able to converse coherently, goal directed thoughts, no distractibility, or pre-occupation.   He also denies suicidal/self-harm/homicidal ideation, psychosis, and paranoia.  Patient answered question appropriately.      Chief Complaint:  Chief Complaint  Patient presents with   Alcohol Intoxication   Visit Diagnosis: Alcohol Abuse  Anxiety     CCA Screening, Triage and Referral (STR)  Patient Reported Information How did you hear about us ? -- [facility] ED)  What Is the Reason for Your Visit/Call Today? Per EDP's note :  Pt  is a 29 y.o. male brought in on IVC due to concern of alcohol intoxication.  Was just recently discharged from our facility for concerns of suicidal statements made while intoxicated.  I reviewed his psychiatric note, seems that he plan to continue with alcohol usage at home but at the time he was endorsing that he is planning to follow-up at work and was not suicidal.  Apparently police called to hotel room today due to concern of suicidality, apparently he had told his friend that if he started drinking again he would kill himself.  Father contacted police and had the patient brought in on IVC.  Patient currently intoxicated having trouble providing much information at this time.  How Long Has  This Been Causing You Problems? > than 6 months  What Do You Feel Would Help You the Most Today? Alcohol or Drug Use Treatment   Have You Recently Had Any Thoughts About Hurting Yourself? No  Are You Planning to Commit Suicide/Harm Yourself At This time? No   Flowsheet Row ED  from 11/13/2024 in [facility] Emergency Department at [facility] ED from 11/08/2024 in [facility] ED from 11/07/2024 in [facility] Emergency Department at [facility]  C-SSRS RISK CATEGORY No Risk No Risk No Risk    Have you Recently Had Thoughts About Hurting Someone Sherral? No  Are You Planning to Harm Someone at This Time? No  Explanation: Denies HI   Have You Used Any Alcohol or Drugs in the Past 24 Hours? Yes  How Long Ago Did You Use Drugs or Alcohol? Earlier today  What Did You Use and How Much? Pt reports that he drank 6 Liters of wine earlier today .SABRA BAC 437   Do You Currently Have a Therapist/Psychiatrist? No  Name of Therapist/Psychiatrist:    Have You Been Recently Discharged From Any Office Practice or Programs? No  Explanation of Discharge From Practice/Program: Pt signed himself out of rehab 3 days ago.     CCA Screening Triage Referral Assessment Type of Contact: Face-to-Face  Telemedicine Service Delivery:   Is this Initial or Reassessment?   Date Telepsych consult ordered in [facility]:    Time Telepsych consult ordered in [facility]:    Location of Assessment: [facility] ED  Provider Location: [facility] ED   Collateral Involvement: None   Does Patient Have a Court Appointed Legal Guardian? No  Legal Guardian Contact Information: n/a  Copy of Legal Guardianship Form: -- (n/a)  Legal Guardian Notified of Arrival: -- (n/a)  Legal Guardian Notified of Pending Discharge: -- (n/a)  If Minor and Not Living with Parent(s), Who has Custody? n/a  Is CPS involved or ever been involved? Never  Is APS involved or ever been involved? Never   Patient Determined To Be At Risk for Harm To Self or Others Based on Review of Patient Reported Information or Presenting Complaint? No  Method: No Plan  Availability of Means: No access or NA  Intent: Vague intent or NA  Notification Required: No need or identified person  Additional  Information for Danger to Others Potential: -- (n/a)  Additional Comments for Danger to Others Potential: n/a  Are There Guns or Other Weapons in Your Home? No  Types of Guns/Weapons: Denies access to guns/ weapons  Are These Weapons Safely Secured?                            -- (Denies access)  Who Could Verify You Are Able To Have These Secured: Denies access  Do You Have any Outstanding Charges, Pending Court Dates, Parole/Probation? Denies pending legal charges  Contacted To Inform of Risk of Harm To Self or Others: -- (n/a)    Does Patient Present under Involuntary Commitment? Yes    [place] of Residence: [place]   Patient Currently Receiving the Following Services: Not Receiving Services   Determination of Need: Routine (7 days)   Options For Referral: Chemical Dependency Intensive Outpatient Therapy (CDIOP); Outpatient Therapy; Medication Management     CCA Biopsychosocial Patient Reported Schizophrenia/Schizoaffective Diagnosis in Past: No   Strengths: Patient is able to communicate his needs   Mental Health Symptoms Depression:  Change in energy/activity  Duration of Depressive symptoms: Duration of Depressive Symptoms: Greater than two weeks   Mania:  None   Anxiety:   Difficulty concentrating; Restlessness   Psychosis:  None   Duration of Psychotic symptoms:    Trauma:  None   Obsessions:  None   Compulsions:  None   Inattention:  None   Hyperactivity/Impulsivity:  None   Oppositional/Defiant Behaviors:  None   Emotional Irregularity:  None   Other Mood/Personality Symptoms:  Severe anxiety    Mental Status Exam Appearance and self-care  Stature:  Average   Weight:  Average weight   Clothing:  Casual   Grooming:  Normal   Cosmetic use:  None   Posture/gait:  Normal   Motor activity:  Not Remarkable   Sensorium  Attention:  Normal   Concentration:  Normal   Orientation:  X5   Recall/memory:  Normal   Affect  and Mood  Affect:  Flat   Mood:  Anxious   Relating  Eye contact:  Normal   Facial expression:  Responsive   Attitude toward examiner:  Cooperative   Thought and Language  Speech flow: Slurred; Slow   Thought content:  Appropriate to Mood and Circumstances   Preoccupation:  None   Hallucinations:  None   Organization:  Coherent   [employer] of Knowledge:  Fair   Intelligence:  Average   Abstraction:  Normal   Judgement:  Fair   Dance Movement Psychotherapist:  Realistic   Insight:  Poor   Decision Making:  Impulsive   Social Functioning  Social Maturity:  Impulsive   Social Judgement:  Heedless   Stress  Stressors:  Surveyor, Quantity; Transitions; Housing; Other (Comment) (ETOH abuse)   Coping Ability:  Exhausted; Overwhelmed   Skill Deficits:  Self-control; Decision making   Supports:  Family     Religion: Religion/Spirituality Are You A Religious Person?: No How Might This Affect Treatment?: n/a  Leisure/Recreation: Leisure / Recreation Do You Have Hobbies?: No  Exercise/Diet: Exercise/Diet Do You Exercise?: No Have You Gained or Lost A Significant Amount of Weight in the Past Six Months?: No Do You Follow a Special Diet?: No Do You Have Any Trouble Sleeping?: Yes   CCA Employment/Education Employment/Work Situation: Employment / Work Situation Employment Situation: Unemployed Patient's Job has Been Impacted by Current Illness: No Has Patient ever Been in Equities Trader?: No  Education: Education Is Patient Currently Attending School?: No Last Grade Completed: 12 Did You Product Manager?: No Did You Have An Individualized Education Program (IIEP): No Did You Have Any Difficulty At [employer]?: No Patient's Education Has Been Impacted by Current Illness: No   CCA Family/Childhood History Family and Relationship History: Family history Marital status: Single Does patient have children?: No  Childhood History:  Childhood History By whom  was/is the patient raised?: Both parents Did patient suffer any verbal/emotional/physical/sexual abuse as a child?: No Did patient suffer from severe childhood neglect?: No Has patient ever been sexually abused/assaulted/raped as an adolescent or adult?: No Was the patient ever a victim of a crime or a disaster?: No Witnessed domestic violence?: No Has patient been affected by domestic violence as an adult?: No       CCA Substance Use Alcohol/Drug Use: Alcohol / Drug Use Pain Medications: See MAR Prescriptions: See MAR Over the Counter: See MAR History of alcohol / drug use?: Yes Longest period of sobriety (when/how long): Patient reports not having any sobriety; pt drank 6 Liters of wine today and BAC was 437  when he arrived to ED. Negative Consequences of Use: Financial, Personal relationships Withdrawal Symptoms: Change in blood pressure, Nausea / Vomiting, Patient aware of relationship between substance abuse and physical/medical complications, Tremors, Sweats                         ASAM's:  Six Dimensions of Multidimensional Assessment  Dimension 1:  Acute Intoxication and/or Withdrawal Potential:      Dimension 2:  Biomedical Conditions and Complications:      Dimension 3:  Emotional, Behavioral, or Cognitive Conditions and Complications:     Dimension 4:  Readiness to Change:     Dimension 5:  Relapse, Continued use, or Continued Problem Potential:     Dimension 6:  Recovery/Living Environment:     ASAM Severity Score:    ASAM Recommended Level of Treatment:     Substance use Disorder (SUD) Substance Use Disorder (SUD)  Checklist Symptoms of Substance Use: Continued use despite having a persistent/recurrent physical/psychological problem caused/exacerbated by use, Evidence of tolerance, Presence of craving or strong urge to use, Social, occupational, recreational activities given up or reduced due to use, Evidence of withdrawal (Comment), Recurrent use that  results in a failure to fulfill major role obligations (work, school, home), Large amounts of time spent to obtain, use or recover from the substance(s), Substance(s) often taken in larger amounts or over longer times than was intended, Continued use despite persistent or recurrent social, interpersonal problems, caused or exacerbated by use, Persistent desire or unsuccessful efforts to cut down or control use, Repeated use in physically hazardous situations  Recommendations for Services/Supports/Treatments: Recommendations for Services/Supports/Treatments Recommendations For Services/Supports/Treatments: Detox, IOP (Intensive Outpatient Program)  Disposition Recommendation per psychiatric provider: There are no psychiatric contraindications to discharge at this time   DSM5 Diagnoses: Patient Active Problem List   Diagnosis Date Noted   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

## 2024-11-14 NOTE — ED Notes (Signed)
 Pt informed of plan to DC. Pt informed would call Sgt Ward to pick up pt to take to [facility] if he is still wanting that. Pt stated he would like to go to [facility] and is good with this RN calling Sgt Ward for a ride.

## 2024-11-14 NOTE — ED Notes (Signed)
 IVC PAPERS  RESCINDED PER  DR  PADUCHOWSKI  MD  INFORMED  [facility] RN

## 2024-11-14 NOTE — BH Assessment (Signed)
 Writer provided inpatient and outpatient resources.

## 2024-11-14 NOTE — Discharge Instructions (Signed)
 Please proceed directly to [facility] for further treatment.

## 2024-11-14 NOTE — ED Notes (Signed)
 Pt refusing breakfast tray at this time, will keep at RN station if pt changes mind.

## 2024-11-14 NOTE — ED Notes (Signed)
 Pt out of room telling this nurse that he feels horrible and is really needing some medication to help him sleep. Pt states he has very bad tremors and is experiencing withdrawals from alcohol. Pt currently has no tremors noted and skin is warm and dry. Pt does appear mildly anxious but no other symptoms noted by this nurse. When asked pt if he was hallucinating, pt states he is and when asked what he is seeing when hallucinating pt states he doesn't actually see anything he just feels like he is not alone. Pt reports his headache is basically gone and denies nausea. MD aware.

## 2024-11-14 NOTE — ED Notes (Signed)
 Sgt Ward here to speak with pt, offering pt a ride to [facility] if he would like help with ETOH. Pt stating he would like to go to [facility], EDP and psych team made aware.

## 2024-11-14 NOTE — ED Provider Notes (Signed)
 Emergency Medicine Observation Re-evaluation Note  Robert Briggs is a 29 y.o. male, seen on rounds today.  Pt initially presented to the ED for complaints of Alcohol Intoxication  Currently, the patient is is no acute distress. Denies any concerns at this time.  Physical Exam  Blood pressure 113/80, pulse 95, temperature 97.9 F (36.6 C), temperature source Oral, resp. rate 16, height 5' 10 (1.778 m), weight 68 kg, SpO2 100%.  Physical Exam: General: No apparent distress Pulm: Normal WOB Neuro: Moving all extremities Psych: Resting comfortably     ED Course / MDM   Clinical Course as of 11/14/24 0709  Mon Nov 13, 2024  1603 Patient states that he feels like he is having alcohol withdrawal.  Given his current presentation and his degree of intoxication I feel this is very unlikely.  No evidence of tremors, or other external symptoms of withdrawal.  Will give him some Zofran  and Tylenol  here and monitor closely if he does start demonstrating further symptoms of withdrawal he is already on CIWA and we will treat it as needed. [SK]  1627 Alcohol of 437, will be medically sober at 1:20 AM. [SK]    Clinical Course User Index [SK] Fernand Rossie HERO, MD    I have reviewed the labs performed to date as well as medications administered while in observation.  Recent changes in the last 24 hours include: No acute events overnight.  Plan   Current plan: Patient awaiting psychiatric disposition.  The patient has been placed in psychiatric observation due to the need to provide a safe environment for the patient while obtaining psychiatric consultation and evaluation, as well as ongoing medical and medication management to treat the patient's condition.      Belenda Alviar, Josette SAILOR, DO 11/14/24 [phone]

## 2024-11-14 NOTE — Progress Notes (Signed)
 Per TTS, provided in-person/virtual outpatient and residential treatment resources for alcohol abuse.  [place], [facility] [phone]

## 2024-11-14 NOTE — ED Provider Notes (Signed)
----------------------------------------- °  10:34 AM on 11/14/2024 ----------------------------------------- Patient has been seen and evaluated by psychiatry.  They believe the patient is safe for discharge home from a psychiatric standpoint.  Patient's medical workup or signs alcohol intoxication upon arrival has been largely nonrevealing.  [facility] is awaiting the patient's arrival we have arranged transport from the emergency department to [facility] for further treatment.   Dorothyann Drivers, MD 11/14/24 1034

## 2024-11-17 ENCOUNTER — Other Ambulatory Visit: Payer: Self-pay

## 2024-11-17 MED ORDER — QUETIAPINE FUMARATE 50 MG PO TABS
50.0000 mg | ORAL_TABLET | Freq: Every day | ORAL | 0 refills | Status: AC
  Filled 2024-11-17: qty 30, 30d supply, fill #0

## 2024-11-17 MED ORDER — QUETIAPINE FUMARATE 50 MG PO TABS: 50.0000 mg | ORAL_TABLET | Freq: Every day | ORAL | 0 refills | Status: AC

## 2024-11-28 ENCOUNTER — Other Ambulatory Visit: Payer: Self-pay
